# Patient Record
Sex: Male | Born: 1970 | Race: White | Hispanic: No | Marital: Married | Smoking: Never smoker
Health system: Southern US, Community
[De-identification: ages and names within clinical notes are randomized; demographics above are authoritative.]

## PROBLEM LIST (undated history)

## (undated) DIAGNOSIS — G5600 Carpal tunnel syndrome, unspecified upper limb: Secondary | ICD-10-CM

## (undated) HISTORY — PX: ANKLE SURGERY: SHX546

---

## 1999-09-16 ENCOUNTER — Encounter: Admission: RE | Admit: 1999-09-16 | Discharge: 1999-09-16 | Payer: Self-pay | Admitting: Family Medicine

## 1999-09-16 ENCOUNTER — Encounter: Payer: Self-pay | Admitting: Family Medicine

## 2004-12-24 ENCOUNTER — Ambulatory Visit (HOSPITAL_COMMUNITY): Admission: RE | Admit: 2004-12-24 | Discharge: 2004-12-24 | Payer: Self-pay | Admitting: Urology

## 2004-12-24 ENCOUNTER — Ambulatory Visit (HOSPITAL_BASED_OUTPATIENT_CLINIC_OR_DEPARTMENT_OTHER): Admission: RE | Admit: 2004-12-24 | Discharge: 2004-12-24 | Payer: Self-pay | Admitting: Urology

## 2004-12-24 ENCOUNTER — Encounter (INDEPENDENT_AMBULATORY_CARE_PROVIDER_SITE_OTHER): Payer: Self-pay | Admitting: *Deleted

## 2007-09-30 ENCOUNTER — Emergency Department (HOSPITAL_COMMUNITY): Admission: EM | Admit: 2007-09-30 | Discharge: 2007-09-30 | Payer: Self-pay | Admitting: Emergency Medicine

## 2010-11-03 ENCOUNTER — Encounter: Payer: Self-pay | Admitting: Family Medicine

## 2010-11-03 ENCOUNTER — Inpatient Hospital Stay (INDEPENDENT_AMBULATORY_CARE_PROVIDER_SITE_OTHER)
Admission: RE | Admit: 2010-11-03 | Discharge: 2010-11-03 | Disposition: A | Discharge status: Home / Self Care | Payer: 59 | Source: Ambulatory Visit | Attending: Family Medicine | Admitting: Family Medicine

## 2010-11-03 ENCOUNTER — Ambulatory Visit
Admission: RE | Admit: 2010-11-03 | Discharge: 2010-11-03 | Disposition: A | Discharge status: Home / Self Care | Payer: 59 | Source: Ambulatory Visit | Attending: Family Medicine | Admitting: Family Medicine

## 2010-11-03 ENCOUNTER — Other Ambulatory Visit: Payer: Self-pay | Admitting: Family Medicine

## 2010-11-03 DIAGNOSIS — T148XXA Other injury of unspecified body region, initial encounter: Secondary | ICD-10-CM

## 2010-11-03 DIAGNOSIS — R079 Chest pain, unspecified: Secondary | ICD-10-CM

## 2010-11-05 ENCOUNTER — Telehealth (INDEPENDENT_AMBULATORY_CARE_PROVIDER_SITE_OTHER): Payer: Self-pay | Admitting: *Deleted

## 2010-11-26 NOTE — Op Note (Signed)
NAME:  Timothy Pineda, Timothy Pineda              ACCOUNT NO.:  [id number]   MEDICAL RECORD NO.:  [id number]          PATIENT TYPE:  AMB   LOCATION:  [facility]                         FACILITY:  [facility]   PHYSICIAN:  Mark C. Vernie Ammons, M.D.  DATE OF BIRTH:  August 03, 1970   DATE OF PROCEDURE:  12/24/2004  DATE OF DISCHARGE:                                 OPERATIVE REPORT   PREOPERATIVE DIAGNOSES:  Multiple scrotal sebaceous cyst.   POSTOPERATIVE DIAGNOSES:  Multiple scrotal sebaceous cyst.   PROCEDURE:  Excision of scrotal sebaceous cyst.   SURGEON:  Mark C. Vernie Ammons, M.D.   ANESTHESIA:  General.   BLOOD LOSS:  Minimal.   DRAINS:  None.   SPECIMENS:  None.   COMPLICATIONS:  None.   INDICATIONS:  The patient is a 40 year old white whose had difficulty with  scrotal sebaceous cyst for some time. He has had them removed in the office  by his primary care physician in the past. He has multiple lesions at this  time not amenable to office base procedure. We discussed the risks,  complications and alternatives. He understands and elected to proceed with  surgical excision.   DESCRIPTION OF OPERATION:  After informed consent, the patient was brought  to the major OR, placed on the table, administered general anesthesia and  his genitalia was then sterilely prepped and draped. There were neural  multiple cysts all proximally 1 cm in size and or smaller. Some were grouped  and some were singly. Each of the single cysts, I incised over the top of  the cyst, dissected the cyst from the surrounding dermal and subdermal  tissue and then closed these with running 3-0 chromic suture. In locations  were there were multiple cysts in a group, I excised an ellipse of tissue  encompassing all the cysts. Bleeding points were cauterized and then these  were also closed with running 3-0 chromic. All of the cysts from the scrotum  were completely excised. The aggregate size would be approximately 8 cm in  length total.  The scrotum was then cleansed and Neosporin was applied as  well as fluffed Kerlix and a scrotal support. The patient was awakened and  taken to recovery room in stable satisfactory condition. He tolerated  procedure well with no intraoperative complications.   I gave him a prescription for 36 Tylox an 10 Keflex 500 mg b.i.d. He is  going to return to my office in one month for follow up, sooner if he should  have any difficulty.       MCO/MEDQ  D:  12/24/2004  T:  12/24/2004  Job:  [id number]

## 2011-04-04 LAB — URINALYSIS, ROUTINE W REFLEX MICROSCOPIC
Glucose, UA: NEGATIVE
Ketones, ur: 15 — AB
Leukocytes, UA: NEGATIVE
Nitrite: NEGATIVE
Protein, ur: 100 — AB
Specific Gravity, Urine: 1.029
Urobilinogen, UA: 0.2
pH: 5.5

## 2011-04-04 LAB — URINE MICROSCOPIC-ADD ON

## 2011-04-04 LAB — DIFFERENTIAL
Eosinophils Relative: 7 — ABNORMAL HIGH
Lymphocytes Relative: 33
Lymphs Abs: 2.2
Monocytes Relative: 6

## 2011-04-04 LAB — I-STAT 8, (EC8 V) (CONVERTED LAB)
Acid-Base Excess: 1
Chloride: 104
Glucose, Bld: 134 — ABNORMAL HIGH
Hemoglobin: 16.3
Potassium: 4.1
Sodium: 139
TCO2: 29

## 2011-04-04 LAB — CBC
HCT: 45.6
Hemoglobin: 15.5
Platelets: 303
RBC: 5.06
WBC: 6.7

## 2011-04-04 LAB — POCT I-STAT CREATININE
Creatinine, Ser: 1
Operator id: 196461

## 2011-06-13 NOTE — Telephone Encounter (Signed)
  Phone Note Outgoing Call   Call placed by: Clemens Catholic LPN,  November 05, 2010 4:57 PM Call placed to: Patient Summary of Call: call back: spoke to pts wife she states that he is doing a lot better. advised her of his lab results and to call back if they have any questions or concerns. Initial call taken by: Clemens Catholic LPN,  November 05, 2010 4:57 PM

## 2011-06-13 NOTE — Letter (Signed)
Summary: Out of Work  [facility]  [address]   [place], [place] [address]   Phone: [phone]  Fax: [phone]    November 03, 2010   Employee:  [name]    To Whom It May Concern:   For Medical reasons,  Timothy Pineda should avoid heavy lifting, pushing, pulling, and straining for one week.   If you need additional information, please feel free to contact our office.         Sincerely,    Donna Christen MD

## 2011-06-13 NOTE — Progress Notes (Signed)
Summary: CHEST PAIN/TICK BITE? (rm 2)   Vital Signs:  Patient Profile:   40 Years Old Male CC:      right chest pain x monday, increased today Height:     67 inches Weight:      175 pounds O2 Sat:      99 % O2 treatment:    Room Air Temp:     98.8 degrees F oral Pulse rate:   80 / minute Resp:     18 per minute BP sitting:   143 / 97  (right arm) Cuff size:   regular  Pt. in pain?   yes    Location:   right chest    Intensity:   2  Vitals Entered By: Lajean Saver RN (November 03, 2010 10:56 AM)                   Updated Prior Medication List: No Medications Current Allergies: No known allergies History of Present Illness Chief Complaint: right chest pain x monday, increased today History of Present Illness:  Subjective:  Patient complains of 3 day history of pain in his right anterior chest, worse with right arm movement and respiration.  No cough or shortness of breath.  The pain does not radiate.  No rash.  No fevers, chills, and sweats.  He recalls no injury or change in activities, but two days prior to the onset he had been working in a cramped space under a vehicle using his right arm to push up a muffler.   He also complains of a tick bite (deer tick) to his left anterior thigh that he noticed last week.                                                                                                                                                                    REVIEW OF SYSTEMS Constitutional Symptoms      Denies fever, chills, night sweats, weight loss, weight gain, and fatigue.  Eyes       Denies change in vision, eye pain, eye discharge, glasses, contact lenses, and eye surgery. Ear/Nose/Throat/Mouth       Denies hearing loss/aids, change in hearing, ear pain, ear discharge, dizziness, frequent runny nose, frequent nose bleeds, sinus problems, sore throat, hoarseness, and tooth pain or bleeding.  Respiratory       Denies dry cough, productive cough,  wheezing, shortness of breath, asthma, bronchitis, and emphysema/COPD.  Cardiovascular       Complains of chest pain.      Denies murmurs and tires easily with exhertion.    Gastrointestinal       Denies stomach pain, nausea/vomiting, diarrhea, constipation, blood in bowel movements, and indigestion. Genitourniary       Denies  painful urination, blood or discharge from penis, kidney stones, and loss of urinary control. Neurological       Denies paralysis, seizures, and fainting/blackouts. Musculoskeletal       Denies muscle pain, joint pain, joint stiffness, decreased range of motion, redness, swelling, muscle weakness, and gout.  Skin       Denies bruising, unusual mles/lumps or sores, and hair/skin or nail changes.  Psych       Denies mood changes, temper/anger issues, anxiety/stress, speech problems, depression, and sleep problems. Other Comments: tick bite x 6 days to left hip with redenss. Patient was working on a car on Saturday. Felt pain in right chest on Monday, it has increased today. He has little pain when still but with turning it is a 10/10.  Denies SOB, but breath "cathces" when he has increased pain.    Past History:  Past Medical History: Unremarkable  Past Surgical History: right ankle  Social History: Married Never Smoked Alcohol use-no Drug use-no Smoking Status:  never Drug Use:  no   Objective:  Appearance:  Patient appears healthy, stated age, and in no acute distress.  However, he appears uncomfortable with movement onto exam table Eyes:  Pupils are equal, round, and reactive to light and accomdation.  Extraocular movement is intact.  Conjunctivae are not inflamed.  Mouth:  No lesions Pharynx:  Normal  Neck:  Supple.  No adenopathy is present.  Lungs:  Clear to auscultation.  Breath sounds are equal.  Chest:  Distinct tenderness over the sternum and right pectoralis muscle, accentuated by flexion of the right pectoralis muscle Heart:  Regular rate  and rhythm without murmurs, rubs, or gallops.  Abdomen:  Nontender without masses or hepatosplenomegaly.  Bowel sounds are present.  No CVA or flank tenderness.  Extremities:  No edema.  Pedal pulses are full and equal.  Skin: L  ant thigh: 1.5cm dia patch erythema  Assessment New Problems: TICK BITE (ICD-E906.4) CHEST PAIN (ICD-786.50)  EKG unremarkable.  Chest X-ray negative.  Suspect mild cellulitis at site of tick bite  Plan New Medications/Changes: LORTAB 5 5-500 MG TABS (HYDROCODONE-ACETAMINOPHEN) One or two tabs by mouth hs as needed pain  #10 (ten) x 0, 11/03/2010, Donna Christen MD NAPROXEN 500 MG TABS (NAPROXEN) One by mouth two times a day pc  #20 x 1, 11/03/2010, Donna Christen MD DOXYCYCLINE HYCLATE 100 MG CAPS (DOXYCYCLINE HYCLATE) One by mouth two times a day  #20 x 0,  11/03/2010, Donna Christen MD  New Orders: Ketorolac-Toradol 15mg  [Z6109] EKG w/ Interpretation [93000] T-Chest x-ray, 2 views [71020] Admin of Therapeutic Inj  intramuscular or subcutaneous [96372] T-Rocky Mtn Spotted Fever AB IgG IgM [[id number]] T- * Misc. Laboratory test [phone] New Patient Level IV [99204] Planning Comments:   Check RMSF and Lyme's disease titers.  Empirically begin doxycycline.  If titers positive will need to extend antibiotic coverage to about 4 weeks. Toradol 60mg  IM Apply ice pack to chest several times daily.  Begin Naproxen.  Lortab at bedtime.  Given a Water quality scientist patient information and instruction sheet on topic costochondritis Followup with [facility] if not improved in two weeks.     The patient and/or caregiver has been counseled thoroughly with regard to medications prescribed including dosage, schedule, interactions, rationale for use, and possible side effects and they verbalize understanding.  Diagnoses and expected course of recovery discussed and will return if not improved as expected or if the condition worsens. Patient and/or caregiver verbalized understanding.  Prescriptions: LORTAB 5 5-500 MG TABS (HYDROCODONE-ACETAMINOPHEN) One or two tabs by mouth hs as needed pain  #10 (ten) x 0   Entered and Authorized by:   Donna Christen MD   Signed by:   Donna Christen MD on 11/03/2010   Method used:   Print then Give to Patient   RxID:   [id number] NAPROXEN 500 MG TABS (NAPROXEN) One by mouth two times a day pc  #20 x 1   Entered and Authorized by:   Donna Christen MD   Signed by:   Donna Christen MD on 11/03/2010   Method used:   Print then Give to Patient   RxID:   [id number] DOXYCYCLINE HYCLATE 100 MG CAPS (DOXYCYCLINE HYCLATE) One by mouth two times a day  #20 x 0   Entered and Authorized by:   Donna Christen MD   Signed by:   Donna Christen MD on 11/03/2010   Method used:   Print then Give to Patient   RxID:    [id number]   Medication Administration  Injection # 1:    Medication: Ketorolac-Toradol 15mg     Diagnosis: CHEST PAIN (ICD-786.50)    Route: IM    Site: RUOQ gluteus    Exp Date: 02/09/2011    Lot #: [id number]    Mfr: Hospira    Comments: 60mg  given    Patient tolerated injection without complications    Given by: Lajean Saver RN (November 03, 2010 11:14 AM)  Orders Added: 1)  Ketorolac-Toradol 15mg  [J1885] 2)  EKG w/ Interpretation [93000] 3)  T-Chest x-ray, 2 views [71020] 4)  Admin of Therapeutic Inj  intramuscular or subcutaneous [96372] 5)  T-Rocky Mtn Spotted Fever AB IgG IgM [[id number]] 6)  T- * Misc. Laboratory test [99999] 7)  New Patient Level IV [phone]

## 2015-07-13 DIAGNOSIS — M9903 Segmental and somatic dysfunction of lumbar region: Secondary | ICD-10-CM | POA: Diagnosis not present

## 2015-07-13 DIAGNOSIS — M5411 Radiculopathy, occipito-atlanto-axial region: Secondary | ICD-10-CM | POA: Diagnosis not present

## 2015-07-13 DIAGNOSIS — M9901 Segmental and somatic dysfunction of cervical region: Secondary | ICD-10-CM | POA: Diagnosis not present

## 2015-07-15 DIAGNOSIS — M9901 Segmental and somatic dysfunction of cervical region: Secondary | ICD-10-CM | POA: Diagnosis not present

## 2015-07-15 DIAGNOSIS — M5411 Radiculopathy, occipito-atlanto-axial region: Secondary | ICD-10-CM | POA: Diagnosis not present

## 2015-07-15 DIAGNOSIS — M9903 Segmental and somatic dysfunction of lumbar region: Secondary | ICD-10-CM | POA: Diagnosis not present

## 2015-07-16 DIAGNOSIS — M5411 Radiculopathy, occipito-atlanto-axial region: Secondary | ICD-10-CM | POA: Diagnosis not present

## 2015-07-16 DIAGNOSIS — M9901 Segmental and somatic dysfunction of cervical region: Secondary | ICD-10-CM | POA: Diagnosis not present

## 2015-07-16 DIAGNOSIS — M9903 Segmental and somatic dysfunction of lumbar region: Secondary | ICD-10-CM | POA: Diagnosis not present

## 2015-07-21 DIAGNOSIS — M5411 Radiculopathy, occipito-atlanto-axial region: Secondary | ICD-10-CM | POA: Diagnosis not present

## 2015-07-21 DIAGNOSIS — M9901 Segmental and somatic dysfunction of cervical region: Secondary | ICD-10-CM | POA: Diagnosis not present

## 2015-07-21 DIAGNOSIS — M9903 Segmental and somatic dysfunction of lumbar region: Secondary | ICD-10-CM | POA: Diagnosis not present

## 2015-07-23 DIAGNOSIS — M9901 Segmental and somatic dysfunction of cervical region: Secondary | ICD-10-CM | POA: Diagnosis not present

## 2015-07-23 DIAGNOSIS — M9903 Segmental and somatic dysfunction of lumbar region: Secondary | ICD-10-CM | POA: Diagnosis not present

## 2015-07-23 DIAGNOSIS — M5411 Radiculopathy, occipito-atlanto-axial region: Secondary | ICD-10-CM | POA: Diagnosis not present

## 2015-07-28 DIAGNOSIS — M9903 Segmental and somatic dysfunction of lumbar region: Secondary | ICD-10-CM | POA: Diagnosis not present

## 2015-07-28 DIAGNOSIS — M5411 Radiculopathy, occipito-atlanto-axial region: Secondary | ICD-10-CM | POA: Diagnosis not present

## 2015-07-28 DIAGNOSIS — M9901 Segmental and somatic dysfunction of cervical region: Secondary | ICD-10-CM | POA: Diagnosis not present

## 2015-07-30 DIAGNOSIS — M9903 Segmental and somatic dysfunction of lumbar region: Secondary | ICD-10-CM | POA: Diagnosis not present

## 2015-07-30 DIAGNOSIS — M9901 Segmental and somatic dysfunction of cervical region: Secondary | ICD-10-CM | POA: Diagnosis not present

## 2015-07-30 DIAGNOSIS — M5411 Radiculopathy, occipito-atlanto-axial region: Secondary | ICD-10-CM | POA: Diagnosis not present

## 2015-08-04 DIAGNOSIS — M9901 Segmental and somatic dysfunction of cervical region: Secondary | ICD-10-CM | POA: Diagnosis not present

## 2015-08-04 DIAGNOSIS — M9903 Segmental and somatic dysfunction of lumbar region: Secondary | ICD-10-CM | POA: Diagnosis not present

## 2015-08-04 DIAGNOSIS — M5411 Radiculopathy, occipito-atlanto-axial region: Secondary | ICD-10-CM | POA: Diagnosis not present

## 2015-08-11 DIAGNOSIS — M9901 Segmental and somatic dysfunction of cervical region: Secondary | ICD-10-CM | POA: Diagnosis not present

## 2015-08-11 DIAGNOSIS — M5411 Radiculopathy, occipito-atlanto-axial region: Secondary | ICD-10-CM | POA: Diagnosis not present

## 2015-08-11 DIAGNOSIS — M9903 Segmental and somatic dysfunction of lumbar region: Secondary | ICD-10-CM | POA: Diagnosis not present

## 2015-08-20 DIAGNOSIS — M5411 Radiculopathy, occipito-atlanto-axial region: Secondary | ICD-10-CM | POA: Diagnosis not present

## 2015-08-20 DIAGNOSIS — M9903 Segmental and somatic dysfunction of lumbar region: Secondary | ICD-10-CM | POA: Diagnosis not present

## 2015-08-20 DIAGNOSIS — M9901 Segmental and somatic dysfunction of cervical region: Secondary | ICD-10-CM | POA: Diagnosis not present

## 2016-02-27 ENCOUNTER — Emergency Department (HOSPITAL_BASED_OUTPATIENT_CLINIC_OR_DEPARTMENT_OTHER)
Admission: EM | Admit: 2016-02-27 | Discharge: 2016-02-28 | Disposition: A | Discharge status: Home / Self Care | Payer: 59 | Attending: Emergency Medicine | Admitting: Emergency Medicine

## 2016-02-27 ENCOUNTER — Emergency Department (HOSPITAL_BASED_OUTPATIENT_CLINIC_OR_DEPARTMENT_OTHER): Payer: 59

## 2016-02-27 ENCOUNTER — Encounter (HOSPITAL_BASED_OUTPATIENT_CLINIC_OR_DEPARTMENT_OTHER): Payer: Self-pay | Admitting: *Deleted

## 2016-02-27 DIAGNOSIS — M940 Chondrocostal junction syndrome [Tietze]: Secondary | ICD-10-CM | POA: Diagnosis not present

## 2016-02-27 DIAGNOSIS — R0602 Shortness of breath: Secondary | ICD-10-CM | POA: Diagnosis not present

## 2016-02-27 DIAGNOSIS — R05 Cough: Secondary | ICD-10-CM | POA: Diagnosis present

## 2016-02-27 LAB — CBC
HEMATOCRIT: 41.9 % (ref 39.0–52.0)
HEMOGLOBIN: 14.8 g/dL (ref 13.0–17.0)
MCH: 31.6 pg (ref 26.0–34.0)
MCHC: 35.3 g/dL (ref 30.0–36.0)
MCV: 89.5 fL (ref 78.0–100.0)
Platelets: 289 10*3/uL (ref 150–400)
RBC: 4.68 MIL/uL (ref 4.22–5.81)
RDW: 13.1 % (ref 11.5–15.5)
WBC: 7.9 10*3/uL (ref 4.0–10.5)

## 2016-02-27 LAB — BASIC METABOLIC PANEL
ANION GAP: 10 (ref 5–15)
BUN: 10 mg/dL (ref 6–20)
CO2: 23 mmol/L (ref 22–32)
Calcium: 9.2 mg/dL (ref 8.9–10.3)
Chloride: 105 mmol/L (ref 101–111)
Creatinine, Ser: 0.95 mg/dL (ref 0.61–1.24)
GFR calc Af Amer: >60 mL/min (ref >60)
Glucose, Bld: 95 mg/dL (ref 65–99)
POTASSIUM: 3.3 mmol/L — AB (ref 3.5–5.1)
SODIUM: 138 mmol/L (ref 135–145)

## 2016-02-27 LAB — TROPONIN I: Troponin I: <0.03 ng/mL (ref <0.03)

## 2016-02-27 MED ORDER — KETOROLAC TROMETHAMINE 60 MG/2ML IM SOLN
60.0000 mg | Freq: Once | INTRAMUSCULAR | Status: AC
  Administered 2016-02-27: 60 mg via INTRAMUSCULAR
  Filled 2016-02-27: qty 2

## 2016-02-27 MED ORDER — GI COCKTAIL ~~LOC~~
30.0000 mL | Freq: Once | ORAL | Status: AC
  Administered 2016-02-27: 30 mL via ORAL
  Filled 2016-02-27: qty 30

## 2016-02-27 NOTE — ED Provider Notes (Signed)
[facility] Provider Note   CSN: [id number] Arrival date & time: 02/27/16  2153  By signing my name below, I, Aggie Moats, attest that this documentation has been prepared under the direction and in the presence of Rubin Dais, MD. Electronically Signed: Aggie Moats, ED Scribe. 02/27/16. 11:13 PM.    History   Chief Complaint Chief Complaint  Patient presents with  . Chest Pain   The history is provided by the patient. No language interpreter was used.  Chest Pain   This is a new problem. The current episode started yesterday. The problem occurs constantly. The problem has not changed since onset.The pain is associated with movement and breathing. The pain is present in the substernal region. The pain is moderate. The quality of the pain is described as dull. The pain does not radiate. Duration of episode(s) is 1 day. The symptoms are aggravated by deep breathing. Associated symptoms include cough. Pertinent negatives include no abdominal pain, no back pain, no claudication, no diaphoresis, no dizziness, no exertional chest pressure, no fever, no headaches, no hemoptysis, no irregular heartbeat, no leg pain, no lower extremity edema, no malaise/fatigue, no nausea, no near-syncope, no numbness, no orthopnea, no palpitations, no PND, no shortness of breath, no sputum production, no syncope, no vomiting and no weakness. He has tried nothing for the symptoms. The treatment provided no relief. Risk factors include male gender.  Pertinent negatives for past medical history include no aneurysm, no connective tissue disease, no DVT, no Marfan's syndrome, no MI and no PE.  Pertinent negatives for family medical history include: no aortic dissection.  Procedure history is negative for cardiac catheterization.   HPI Comments:  Timothy Pineda is a 45 y.o. male who presents to the Emergency Department complaining of constant, moderate chest pain, which started last night. Pt reports that he  noticed himself breathing heavier than normal last night. Pain is described as dull. Associated symptoms include SOB, nonproductive cough and fatigue. Pt has taken two aspirins today. No modifying factors. Denies wheezing, abdominal pain, sore throat or edema. No recent long travels.  No leg pain.     History reviewed. No pertinent past medical history.  There are no active problems to display for this patient.   History reviewed. No pertinent surgical history.     Home Medications    Prior to Admission medications   Not on File    Family History History reviewed. No pertinent family history.  Social History Social History  Substance Use Topics  . Smoking status: Never Smoker  . Smokeless tobacco: Never Used  . Alcohol use Yes     Allergies   Review of patient's allergies indicates no known allergies.   Review of Systems Review of Systems  Constitutional: Negative for appetite change, diaphoresis, fatigue, fever and malaise/fatigue.  HENT: Negative for sore throat.   Respiratory: Positive for cough. Negative for hemoptysis, sputum production, chest tightness, shortness of breath and wheezing.   Cardiovascular: Positive for chest pain. Negative for palpitations, orthopnea, claudication, leg swelling, syncope, PND and near-syncope.  Gastrointestinal: Negative for abdominal pain, nausea and vomiting.  Musculoskeletal: Negative for back pain.  Neurological: Negative for dizziness, weakness, numbness and headaches.  All other systems reviewed and are negative.    Physical Exam Updated Vital Signs BP 118/90   Pulse 80   Temp 98 F (36.7 C) (Oral)   Resp 18   Ht 5\' 7"  (1.702 m)   Wt 170 lb (77.1 kg)   SpO2 97%  BMI 26.63 kg/m   Physical Exam  Constitutional: He appears well-developed and well-nourished.  HENT:  Head: Normocephalic.  Mouth/Throat: No oropharyngeal exudate.  Eyes: Conjunctivae and EOM are normal. Pupils are equal, round, and reactive to  light. Right eye exhibits no discharge. Left eye exhibits no discharge. No scleral icterus.  Neck: Normal range of motion. Neck supple. No JVD present. No tracheal deviation present.  Trachea is midline. No stridor or carotid bruits.  Cardiovascular: Normal rate, regular rhythm, normal heart sounds and intact distal pulses.   No murmur heard. Pulmonary/Chest: Effort normal and breath sounds normal. No stridor. No respiratory distress. He has no wheezes. He has no rales. He exhibits tenderness.  Lungs CTA bilaterally.  Abdominal: Soft. He exhibits no distension. There is no tenderness. There is no rebound and no guarding.  Very hyperactive bowel sounds throughout.  Musculoskeletal: Normal range of motion. He exhibits no edema or tenderness.       Right lower leg: Normal. He exhibits no tenderness, no bony tenderness, no swelling, no edema, no deformity and no laceration.       Left lower leg: Normal. He exhibits no tenderness, no bony tenderness, no swelling, no edema, no deformity and no laceration.  No cords   Lymphadenopathy:    He has no cervical adenopathy.  Neurological: He is alert. He has normal reflexes.  Skin: Skin is warm. Capillary refill takes less than 2 seconds.  Psychiatric: He has a normal mood and affect. His behavior is normal.  Nursing note and vitals reviewed.    ED Treatments / Results  DIAGNOSTIC STUDIES: Vitals:   02/28/16 0130 02/28/16 0151  BP: 142/99 142/99  Pulse: 84 92  Resp: 12 15  Temp:      Oxygen Saturation is 97% on room air, normal by my interpretation.   Results for orders placed or performed during the hospital encounter of 02/27/16  Basic metabolic panel  Result Value Ref Range   Sodium 138 135 - 145 mmol/L   Potassium 3.3 (L) 3.5 - 5.1 mmol/L   Chloride 105 101 - 111 mmol/L   CO2 23 22 - 32 mmol/L   Glucose, Bld 95 65 - 99 mg/dL   BUN 10 6 - 20 mg/dL   Creatinine, Ser 4.690.95 0.61 - 1.24 mg/dL   Calcium 9.2 8.9 - 62.910.3 mg/dL   GFR calc non  Af Amer >60 >60 mL/min   GFR calc Af Amer >60 >60 mL/min   Anion gap 10 5 - 15  CBC  Result Value Ref Range   WBC 7.9 4.0 - 10.5 K/uL   RBC 4.68 4.22 - 5.81 MIL/uL   Hemoglobin 14.8 13.0 - 17.0 g/dL   HCT 52.841.9 41.339.0 - 24.452.0 %   MCV 89.5 78.0 - 100.0 fL   MCH 31.6 26.0 - 34.0 pg   MCHC 35.3 30.0 - 36.0 g/dL   RDW 01.013.1 27.211.5 - 53.615.5 %   Platelets 289 150 - 400 K/uL  Troponin I  Result Value Ref Range   Troponin I <0.03 <0.03 ng/mL  Troponin I  Result Value Ref Range   Troponin I <0.03 <0.03 ng/mL   Dg Chest 2 View  Result Date: 02/27/2016 CLINICAL DATA:  Shortness of breath and chest pressure beginning yesterday. EXAM: CHEST  2 VIEW COMPARISON:  None. FINDINGS: Heart size is normal. Mediastinal shadows are normal. The lungs are clear except for a left lower lobe granuloma. No bronchial thickening. No infiltrate, mass, effusion or collapse. Pulmonary vascularity is normal.  No bony abnormality. IMPRESSION: Normal except for left lower lobe granuloma. Electronically Signed   By: Paulina FusiMark  Shogry M.D.   On: 02/27/2016 22:31    COORDINATION OF CARE:  11:13 PM Discussed treatment plan with pt at bedside, which includes repeat blood work, and pt agreed to plan.  Procedures Procedures (including critical care time)  Medications Ordered in ED Medications  ketorolac (TORADOL) injection 60 mg (60 mg Intramuscular Given 02/27/16 2357)  gi cocktail (Maalox,Lidocaine,Donnatal) (30 mLs Oral Given 02/27/16 2357)  methocarbamol (ROBAXIN) tablet 1,000 mg (1,000 mg Oral Given 02/28/16 0021)     Initial Impression / Assessment and Plan / ED Course  I have reviewed the triage vital signs and the nursing notes.  Pertinent labs & imaging results that were available during my care of the patient were reviewed by me and considered in my medical decision making (see chart for details).  Clinical Course   Vitals:   02/28/16 0130 02/28/16 0151  BP: 142/99 142/99  Pulse: 84 92  Resp: 12 15  Temp:      Results for orders placed or performed during the hospital encounter of 02/27/16  Basic metabolic panel  Result Value Ref Range   Sodium 138 135 - 145 mmol/L   Potassium 3.3 (L) 3.5 - 5.1 mmol/L   Chloride 105 101 - 111 mmol/L   CO2 23 22 - 32 mmol/L   Glucose, Bld 95 65 - 99 mg/dL   BUN 10 6 - 20 mg/dL   Creatinine, Ser 0.270.95 0.61 - 1.24 mg/dL   Calcium 9.2 8.9 - 25.310.3 mg/dL   GFR calc non Af Amer >60 >60 mL/min   GFR calc Af Amer >60 >60 mL/min   Anion gap 10 5 - 15  CBC  Result Value Ref Range   WBC 7.9 4.0 - 10.5 K/uL   RBC 4.68 4.22 - 5.81 MIL/uL   Hemoglobin 14.8 13.0 - 17.0 g/dL   HCT 66.441.9 40.339.0 - 47.452.0 %   MCV 89.5 78.0 - 100.0 fL   MCH 31.6 26.0 - 34.0 pg   MCHC 35.3 30.0 - 36.0 g/dL   RDW 25.913.1 56.311.5 - 87.515.5 %   Platelets 289 150 - 400 K/uL  Troponin I  Result Value Ref Range   Troponin I <0.03 <0.03 ng/mL  Troponin I  Result Value Ref Range   Troponin I <0.03 <0.03 ng/mL   Dg Chest 2 View  Result Date: 02/27/2016 CLINICAL DATA:  Shortness of breath and chest pressure beginning yesterday. EXAM: CHEST  2 VIEW COMPARISON:  None. FINDINGS: Heart size is normal. Mediastinal shadows are normal. The lungs are clear except for a left lower lobe granuloma. No bronchial thickening. No infiltrate, mass, effusion or collapse. Pulmonary vascularity is normal. No bony abnormality. IMPRESSION: Normal except for left lower lobe granuloma. Electronically Signed   By: Paulina FusiMark  Shogry M.D.   On: 02/27/2016 22:31     EKG Interpretation  Date/Time:  Saturday February 27 2016 22:01:54 EDT Ventricular Rate:  76 PR Interval:  152 QRS Duration: 92 QT Interval:  354 QTC Calculation: 398 R Axis:   60 Text Interpretation:  Normal sinus rhythm with sinus arrhythmia Confirmed by [facility]  MD, Aislinn Feliz ([id number]) on 02/27/2016 11:13:05 PM       Final Clinical Impressions(s) / ED Diagnoses   Final diagnoses:  None  PERC negative Wells 0 highly doubt PE. Rules out for MI with negative EKG and  2 negative troponins in the setting of ongoing pain.   Feeling  better post medication.  HEART score is 1.  Pain is consistent with costochondritis.  Will treat for same.  All questions answered to patient's satisfaction. Based on history and exam patient has been appropriately medically screened and emergency conditions excluded. Patient is stable for discharge at this time. Follow up with your PMDfor recheck in 2 daysand strict return precautions given.   New Prescriptions New Prescriptions   No medications on file  I personally performed the services described in this documentation, which was scribed in my presence. The recorded information has been reviewed and is accurate.       Cy Blamer, MD 02/28/16 [phone]

## 2016-02-27 NOTE — ED Triage Notes (Signed)
Pt states he noticed himself breathing heavier than usual last night. [facility] and [facility] was sore "like someone sitting on me." Radiates to left. Non prod cough at times. No energy. Denies other s/s.

## 2016-02-28 ENCOUNTER — Encounter (HOSPITAL_BASED_OUTPATIENT_CLINIC_OR_DEPARTMENT_OTHER): Payer: Self-pay | Admitting: Emergency Medicine

## 2016-02-28 DIAGNOSIS — M940 Chondrocostal junction syndrome [Tietze]: Secondary | ICD-10-CM | POA: Diagnosis not present

## 2016-02-28 LAB — TROPONIN I

## 2016-02-28 MED ORDER — METHOCARBAMOL 500 MG PO TABS
1000.0000 mg | ORAL_TABLET | Freq: Once | ORAL | Status: AC
  Administered 2016-02-28: 1000 mg via ORAL
  Filled 2016-02-28: qty 2

## 2016-02-28 MED ORDER — DICLOFENAC SODIUM ER 100 MG PO TB24: 100.0000 mg | ORAL_TABLET | Freq: Every day | ORAL | 0 refills | Status: DC

## 2016-02-28 MED ORDER — METHOCARBAMOL 500 MG PO TABS: 500.0000 mg | ORAL_TABLET | Freq: Two times a day (BID) | ORAL | 0 refills | Status: DC

## 2016-12-29 DIAGNOSIS — M9903 Segmental and somatic dysfunction of lumbar region: Secondary | ICD-10-CM | POA: Diagnosis not present

## 2016-12-29 DIAGNOSIS — M5411 Radiculopathy, occipito-atlanto-axial region: Secondary | ICD-10-CM | POA: Diagnosis not present

## 2016-12-29 DIAGNOSIS — M9901 Segmental and somatic dysfunction of cervical region: Secondary | ICD-10-CM | POA: Diagnosis not present

## 2017-01-05 DIAGNOSIS — M9903 Segmental and somatic dysfunction of lumbar region: Secondary | ICD-10-CM | POA: Diagnosis not present

## 2017-01-05 DIAGNOSIS — M5411 Radiculopathy, occipito-atlanto-axial region: Secondary | ICD-10-CM | POA: Diagnosis not present

## 2017-01-05 DIAGNOSIS — M9901 Segmental and somatic dysfunction of cervical region: Secondary | ICD-10-CM | POA: Diagnosis not present

## 2017-01-12 DIAGNOSIS — M9901 Segmental and somatic dysfunction of cervical region: Secondary | ICD-10-CM | POA: Diagnosis not present

## 2017-01-12 DIAGNOSIS — M5411 Radiculopathy, occipito-atlanto-axial region: Secondary | ICD-10-CM | POA: Diagnosis not present

## 2017-01-12 DIAGNOSIS — M9903 Segmental and somatic dysfunction of lumbar region: Secondary | ICD-10-CM | POA: Diagnosis not present

## 2017-01-19 DIAGNOSIS — M9901 Segmental and somatic dysfunction of cervical region: Secondary | ICD-10-CM | POA: Diagnosis not present

## 2017-01-19 DIAGNOSIS — M9903 Segmental and somatic dysfunction of lumbar region: Secondary | ICD-10-CM | POA: Diagnosis not present

## 2017-01-19 DIAGNOSIS — M5411 Radiculopathy, occipito-atlanto-axial region: Secondary | ICD-10-CM | POA: Diagnosis not present

## 2017-02-13 DIAGNOSIS — N528 Other male erectile dysfunction: Secondary | ICD-10-CM | POA: Diagnosis not present

## 2017-02-15 MED FILL — SILDENAFIL CITRATE 100 MG T: 100 | 30 days supply | Qty: 6 | Fill #0

## 2017-03-07 IMAGING — CR DG CHEST 2V
2 series · 2 of 2 positions shown · non-contrast
Comparison: None.

CLINICAL DATA: Shortness of breath and chest pressure beginning
yesterday.

EXAM:
CHEST  2 VIEW

[w chest pa]
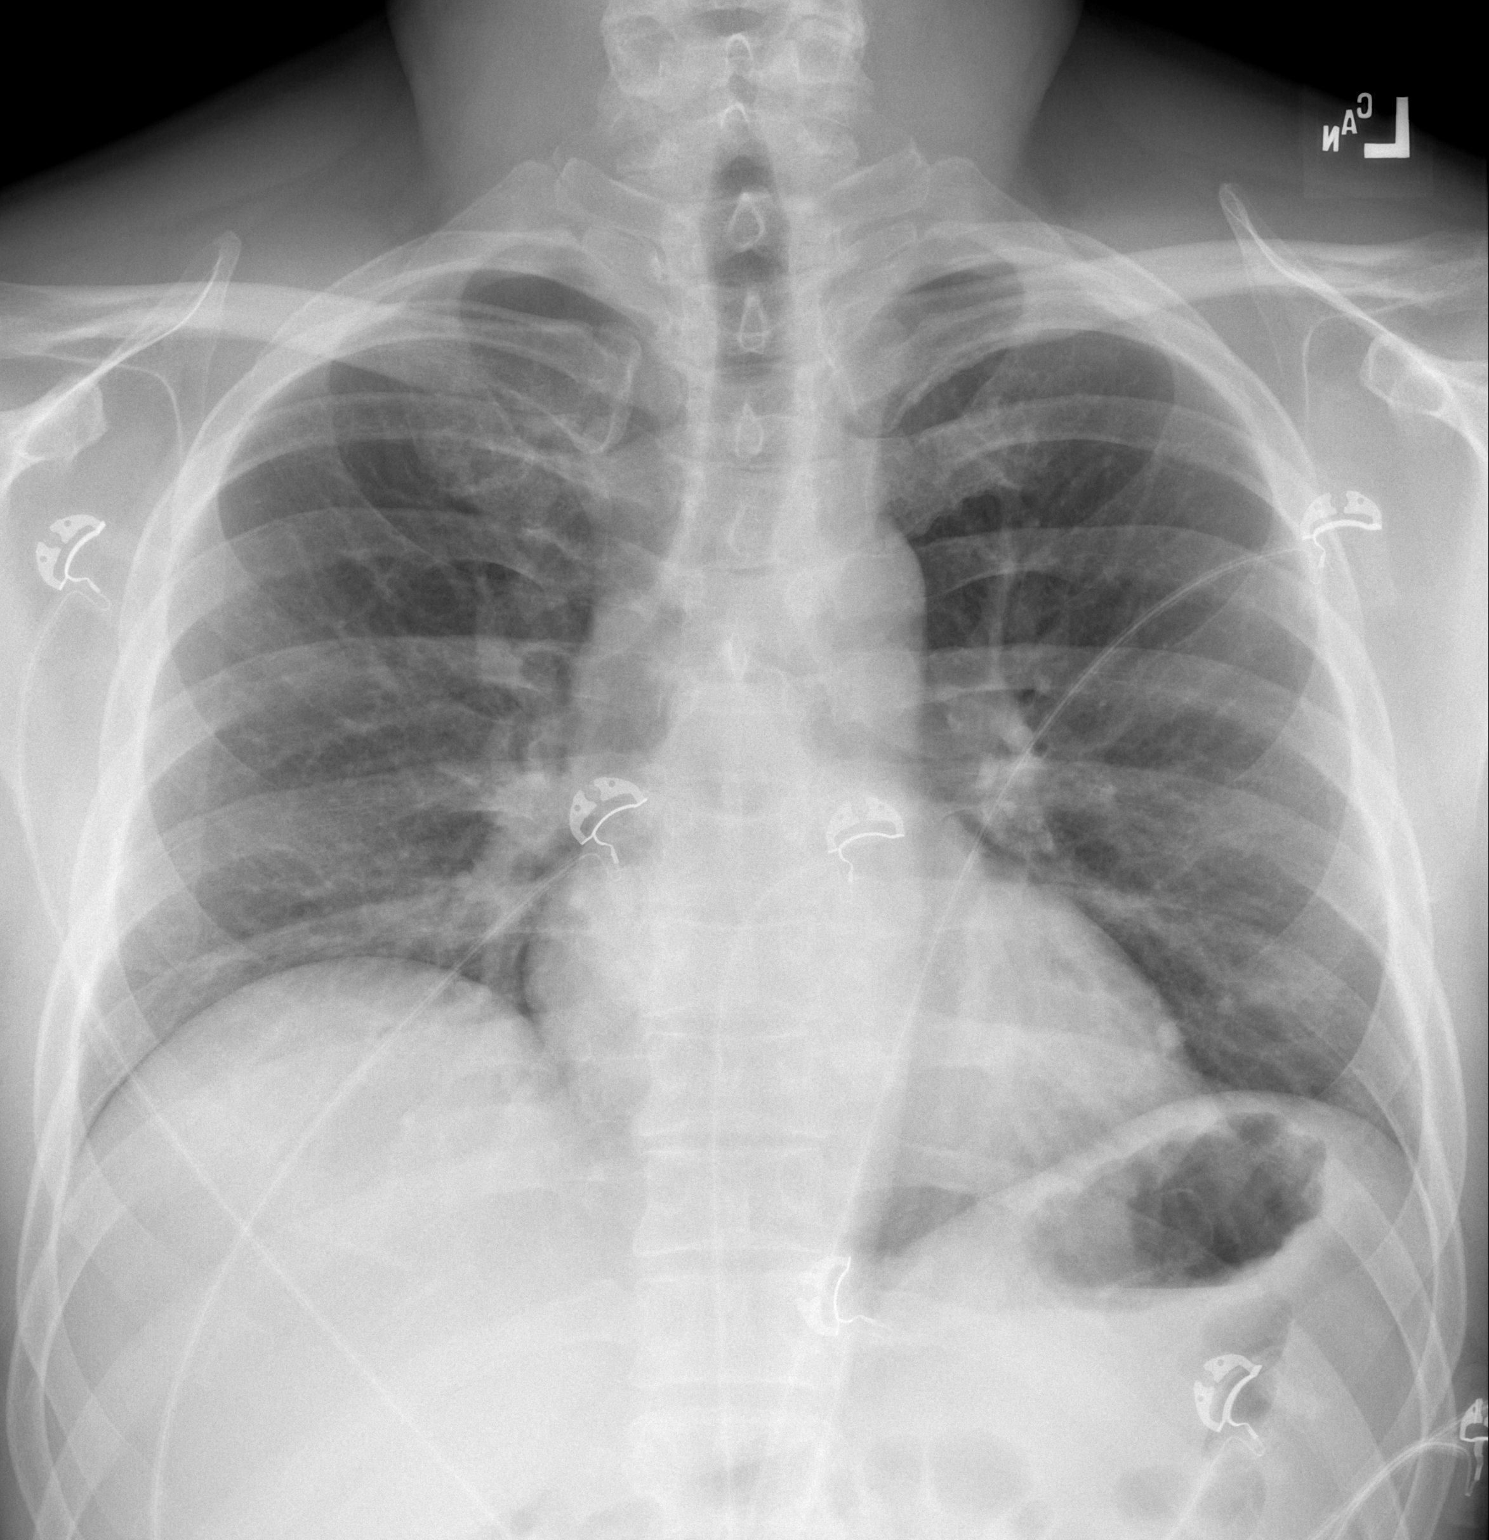

[w chest lat]
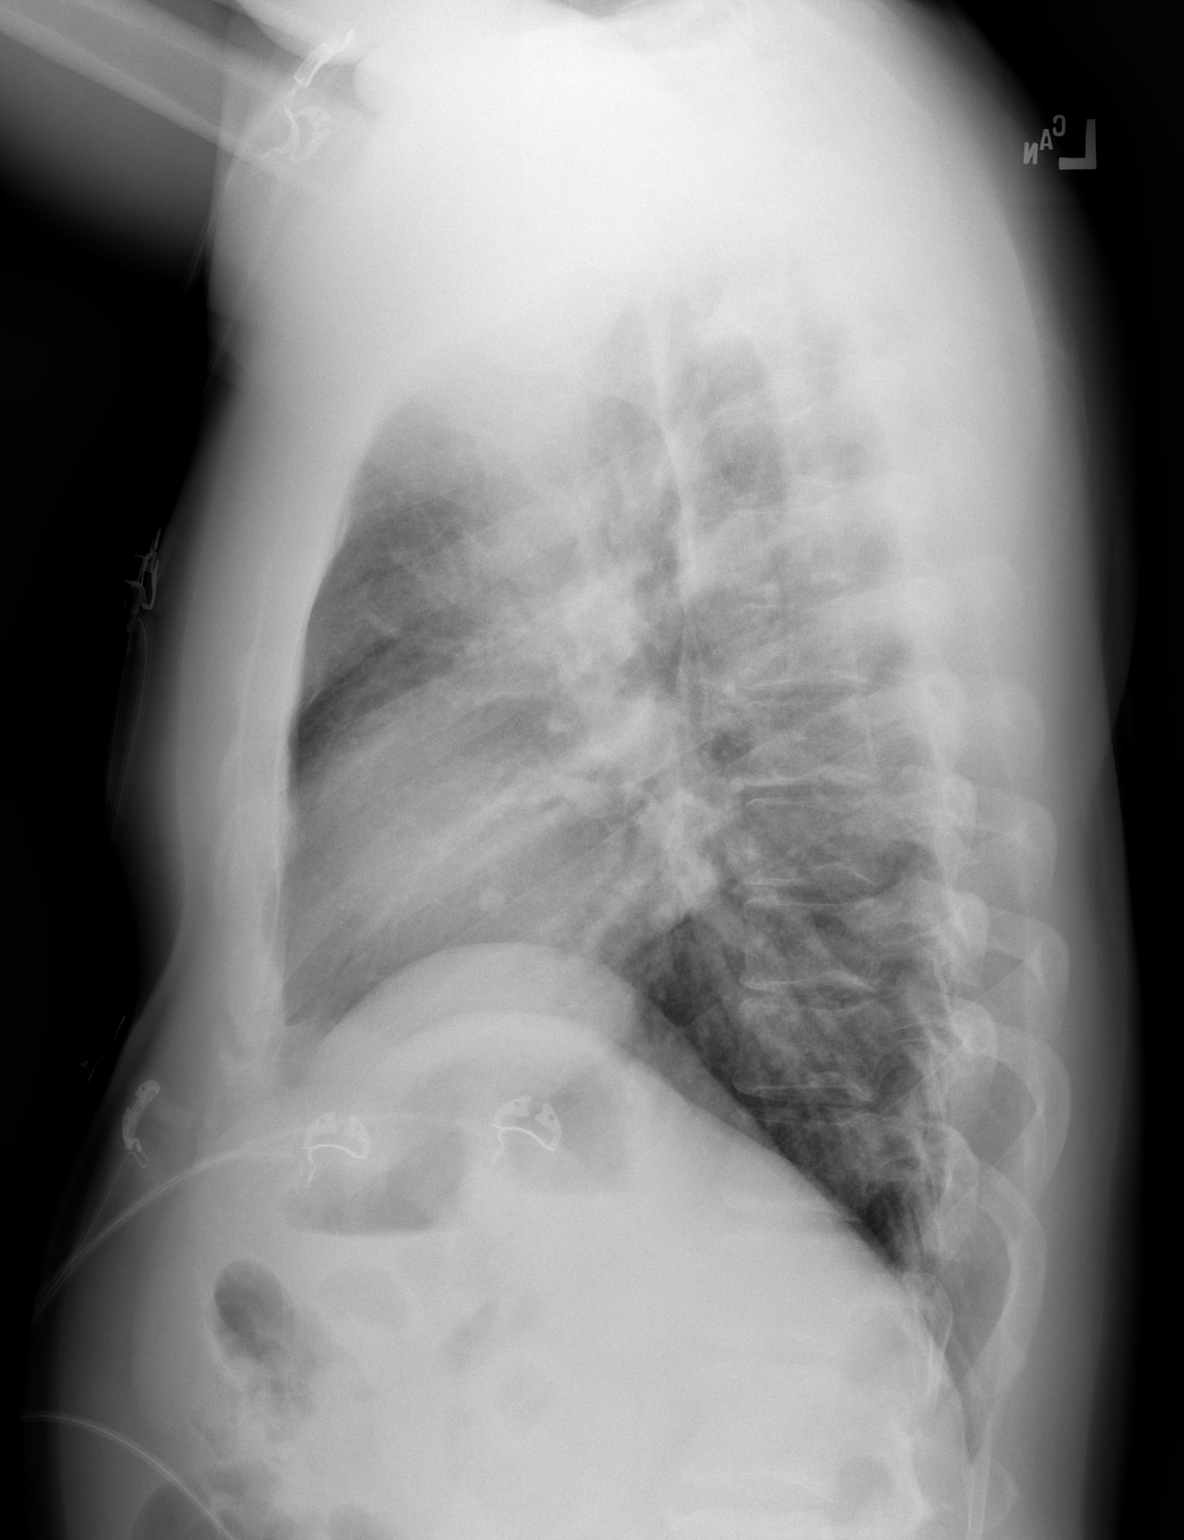

[2 of 2 positions shown; findings below may reference images not displayed]

FINDINGS: Heart size is normal. Mediastinal shadows are normal. The lungs are
clear except for a left lower lobe granuloma. No bronchial
thickening. No infiltrate, mass, effusion or collapse. Pulmonary
vascularity is normal. No bony abnormality.
IMPRESSION: Normal except for left lower lobe granuloma.

## 2017-05-08 DIAGNOSIS — L989 Disorder of the skin and subcutaneous tissue, unspecified: Secondary | ICD-10-CM | POA: Diagnosis not present

## 2017-05-08 DIAGNOSIS — Z13220 Encounter for screening for lipoid disorders: Secondary | ICD-10-CM | POA: Diagnosis not present

## 2017-05-08 DIAGNOSIS — Z791 Long term (current) use of non-steroidal anti-inflammatories (NSAID): Secondary | ICD-10-CM | POA: Diagnosis not present

## 2017-05-08 DIAGNOSIS — Z23 Encounter for immunization: Secondary | ICD-10-CM | POA: Diagnosis not present

## 2017-05-08 DIAGNOSIS — Z0000 Encounter for general adult medical examination without abnormal findings: Secondary | ICD-10-CM | POA: Diagnosis not present

## 2017-05-08 DIAGNOSIS — Z113 Encounter for screening for infections with a predominantly sexual mode of transmission: Secondary | ICD-10-CM | POA: Diagnosis not present

## 2017-05-08 DIAGNOSIS — R51 Headache: Secondary | ICD-10-CM | POA: Diagnosis not present

## 2017-06-23 DIAGNOSIS — L918 Other hypertrophic disorders of the skin: Secondary | ICD-10-CM | POA: Diagnosis not present

## 2017-12-26 DIAGNOSIS — M940 Chondrocostal junction syndrome [Tietze]: Secondary | ICD-10-CM | POA: Diagnosis not present

## 2017-12-26 MED FILL — DICLOFENAC SOD ER 100 MG TA: 100 | 10 days supply | Qty: 10 | Fill #0

## 2017-12-26 MED FILL — METHOCARBAMOL 500 MG TABLET: 500 | 10 days supply | Qty: 20 | Fill #0

## 2018-06-11 DIAGNOSIS — M9903 Segmental and somatic dysfunction of lumbar region: Secondary | ICD-10-CM | POA: Diagnosis not present

## 2018-06-11 DIAGNOSIS — M9901 Segmental and somatic dysfunction of cervical region: Secondary | ICD-10-CM | POA: Diagnosis not present

## 2018-06-11 DIAGNOSIS — M5411 Radiculopathy, occipito-atlanto-axial region: Secondary | ICD-10-CM | POA: Diagnosis not present

## 2018-06-14 DIAGNOSIS — M9901 Segmental and somatic dysfunction of cervical region: Secondary | ICD-10-CM | POA: Diagnosis not present

## 2018-06-14 DIAGNOSIS — M5411 Radiculopathy, occipito-atlanto-axial region: Secondary | ICD-10-CM | POA: Diagnosis not present

## 2018-06-14 DIAGNOSIS — M9903 Segmental and somatic dysfunction of lumbar region: Secondary | ICD-10-CM | POA: Diagnosis not present

## 2018-06-21 DIAGNOSIS — M9901 Segmental and somatic dysfunction of cervical region: Secondary | ICD-10-CM | POA: Diagnosis not present

## 2018-06-21 DIAGNOSIS — M5411 Radiculopathy, occipito-atlanto-axial region: Secondary | ICD-10-CM | POA: Diagnosis not present

## 2018-06-21 DIAGNOSIS — M9903 Segmental and somatic dysfunction of lumbar region: Secondary | ICD-10-CM | POA: Diagnosis not present

## 2018-06-26 DIAGNOSIS — M9901 Segmental and somatic dysfunction of cervical region: Secondary | ICD-10-CM | POA: Diagnosis not present

## 2018-06-26 DIAGNOSIS — M5411 Radiculopathy, occipito-atlanto-axial region: Secondary | ICD-10-CM | POA: Diagnosis not present

## 2018-06-26 DIAGNOSIS — M9903 Segmental and somatic dysfunction of lumbar region: Secondary | ICD-10-CM | POA: Diagnosis not present

## 2018-07-09 DIAGNOSIS — M5411 Radiculopathy, occipito-atlanto-axial region: Secondary | ICD-10-CM | POA: Diagnosis not present

## 2018-07-09 DIAGNOSIS — M9903 Segmental and somatic dysfunction of lumbar region: Secondary | ICD-10-CM | POA: Diagnosis not present

## 2018-07-09 DIAGNOSIS — M9901 Segmental and somatic dysfunction of cervical region: Secondary | ICD-10-CM | POA: Diagnosis not present

## 2018-07-13 MED FILL — SILDENAFIL CITRATE 100 MG T: 100 | 30 days supply | Qty: 6 | Fill #0

## 2018-07-31 ENCOUNTER — Encounter: Payer: Self-pay | Admitting: Physician Assistant

## 2018-07-31 ENCOUNTER — Ambulatory Visit: Payer: Self-pay | Admitting: Physician Assistant

## 2018-07-31 VITALS — BP 145/78 | HR 93 | Temp 99.2°F | Resp 18 | Wt 188.0 lb

## 2018-07-31 DIAGNOSIS — R030 Elevated blood-pressure reading, without diagnosis of hypertension: Secondary | ICD-10-CM

## 2018-07-31 DIAGNOSIS — R6889 Other general symptoms and signs: Secondary | ICD-10-CM

## 2018-07-31 DIAGNOSIS — J111 Influenza due to unidentified influenza virus with other respiratory manifestations: Secondary | ICD-10-CM

## 2018-07-31 DIAGNOSIS — R69 Illness, unspecified: Secondary | ICD-10-CM

## 2018-07-31 LAB — POCT INFLUENZA A/B
INFLUENZA A, POC: NEGATIVE
Influenza B, POC: NEGATIVE

## 2018-07-31 MED ORDER — BENZOCAINE-MENTHOL 15-3.6 MG MT LOZG: 1.0000 | LOZENGE | OROMUCOSAL | 0 refills | Status: DC | PRN

## 2018-07-31 MED ORDER — OSELTAMIVIR PHOSPHATE 75 MG PO CAPS: 75.0000 mg | ORAL_CAPSULE | Freq: Two times a day (BID) | ORAL | 0 refills | Status: DC

## 2018-07-31 MED FILL — OSELTAMIVIR PHOSPHATE 75 MG: 75 | 5 days supply | Qty: 10 | Fill #0

## 2018-07-31 MED FILL — SORE THROAT LOZENGE: 15-3.6 | 2 days supply | Qty: 18 | Fill #0

## 2018-07-31 NOTE — Patient Instructions (Addendum)
Your symptoms are suggestive of a flulike illness.  I have sent in a prescription for Tamiflu.  You are contagious until you are  symptom-free for 24 hours.Please stay out of work until you are no longer contagious.  You can use over-the-counter Tylenol and ibuprofen for general discomfort and fever.  Use Cepacol lunges for sore throat.  Two major complications after the flu are pneumonia and sinus infections. Please be aware of this and if you are not any better in 7-10 days or you develop worsening cough or sinus pressure, seek care at family doctor or the ED. Continue to wash your hands and wear a mask daily especially around other people.   In terms of elevated blood pressure, I would like you to check your blood pressure at least a couple times over the next week outside of the office and document these values. It is best if you check the blood pressure at different times in the day. Your goal is <140/90. If your values are consistently above this goal, please contact your family doctor for further evaluation. If you start to have chest pain, blurred vision, shortness of breath, severe headache, lower leg swelling, or nausea/vomiting please seek care immediately here or at the ED.     Influenza, Adult Influenza is also called "the flu." It is an infection in the lungs, nose, and throat (respiratory tract). It is caused by a virus. The flu causes symptoms that are similar to symptoms of a cold. It also causes a high fever and body aches. The flu spreads easily from person to person (is contagious). Getting a flu shot (influenza vaccination) every year is the best way to prevent the flu. What are the causes? This condition is caused by the influenza virus. You can get the virus by:  Breathing in droplets that are in the air from the cough or sneeze of a person who has the virus.  Touching something that has the virus on it (is contaminated) and then touching your mouth, nose, or eyes. What  increases the risk? Certain things may make you more likely to get the flu. These include:  Not washing your hands often.  Having close contact with many people during cold and flu season.  Touching your mouth, eyes, or nose without first washing your hands.  Not getting a flu shot every year. You may have a higher risk for the flu, along with serious problems such as a lung infection (pneumonia), if you:  Are older than 65.  Are pregnant.  Have a weakened disease-fighting system (immune system) because of a disease or taking certain medicines.  Have a long-term (chronic) illness, such as: ? Heart, kidney, or lung disease. ? Diabetes. ? Asthma.  Have a liver disorder.  Are very overweight (morbidly obese).  Have anemia. This is a condition that affects your red blood cells. What are the signs or symptoms? Symptoms usually begin suddenly and last 4-14 days. They may include:  Fever and chills.  Headaches, body aches, or muscle aches.  Sore throat.  Cough.  Runny or stuffy (congested) nose.  Chest discomfort.  Not wanting to eat as much as normal (poor appetite).  Weakness or feeling tired (fatigue).  Dizziness.  Feeling sick to your stomach (nauseous) or throwing up (vomiting). How is this treated? If the flu is found early, you can be treated with medicine that can help reduce how bad the illness is and how long it lasts (antiviral medicine). This may be given by  mouth (orally) or through an IV tube. Taking care of yourself at home can help your symptoms get better. Your doctor may suggest:  Taking over-the-counter medicines.  Drinking plenty of fluids. The flu often goes away on its own. If you have very bad symptoms or other problems, you may be treated in a hospital. Follow these instructions at home:     Activity  Rest as needed. Get plenty of sleep.  Stay home from work or school as told by your doctor. ? Do not leave home until you do not have  a fever for 24 hours without taking medicine. ? Leave home only to visit your doctor. Eating and drinking  Take an ORS (oral rehydration solution). This is a drink that is sold at pharmacies and stores.  Drink enough fluid to keep your pee (urine) pale yellow.  Drink clear fluids in small amounts as you are able. Clear fluids include: ? Water. ? Ice chips. ? Fruit juice that has water added (diluted fruit juice). ? Low-calorie sports drinks.  Eat bland, easy-to-digest foods in small amounts as you are able. These foods include: ? Bananas. ? Applesauce. ? Rice. ? Lean meats. ? Toast. ? Crackers.  Do not eat or drink: ? Fluids that have a lot of sugar or caffeine. ? Alcohol. ? Spicy or fatty foods. General instructions  Take over-the-counter and prescription medicines only as told by your doctor.  Use a cool mist humidifier to add moisture to the air in your home. This can make it easier for you to breathe.  Cover your mouth and nose when you cough or sneeze.  Wash your hands with soap and water often, especially after you cough or sneeze. If you cannot use soap and water, use alcohol-based hand sanitizer.  Keep all follow-up visits as told by your doctor. This is important. How is this prevented?   Get a flu shot every year. You may get the flu shot in late summer, fall, or winter. Ask your doctor when you should get your flu shot.  Avoid contact with people who are sick during fall and winter (cold and flu season). Contact a doctor if:  You get new symptoms.  You have: ? Chest pain. ? Watery poop (diarrhea). ? A fever.  Your cough gets worse.  You start to have more mucus.  You feel sick to your stomach.  You throw up. Get help right away if you:  Have shortness of breath.  Have trouble breathing.  Have skin or nails that turn a bluish color.  Have very bad pain or stiffness in your neck.  Get a sudden headache.  Get sudden pain in your face or  ear.  Cannot eat or drink without throwing up. Summary  Influenza ("the flu") is an infection in the lungs, nose, and throat. It is caused by a virus.  Take over-the-counter and prescription medicines only as told by your doctor.  Getting a flu shot every year is the best way to avoid getting the flu. This information is not intended to replace advice given to you by your health care provider. Make sure you discuss any questions you have with your health care provider. Document Released: 04/05/2008 Document Revised: 12/13/2017 Document Reviewed: 12/13/2017 Elsevier Interactive Patient Education  2019 [employer].

## 2018-07-31 NOTE — Progress Notes (Signed)
MRN: [id number] DOB: [birth year]  Subjective:   Timothy Pineda is a 48 y.o. male presenting for chief complaint of Headache (STARTED THIS MORNING); Chills (STARTED THIS MORNING); and Generalized Body Aches (STARTED THIS MORNING) .  Reports sudden onset subjective fever, chills, body aches, mild headache, and scratchy throat. Denies sinus pain, ear drainage, inability to swallow, voice change, dry cough, productive cough, wheezing, shortness of breath and chest pain, nausea, vomiting, abdominal pain and diarrhea. Has not tried anything for relief. Has not had anything to drink today except coffee. Has had sick exposure to daughter, just dx with flu, on tamiflu, and coworkers.  No PMH of seasonal allergies, asthma, COPD, autoimmune disorder, DM, or HTN.  Patient has not had flu shot this season. Denies smoking. Denies any other aggravating or relieving factors, no other questions or concerns.  Review of Systems  Eyes: Negative for blurred vision and double vision.  Cardiovascular: Negative for palpitations and leg swelling.  Genitourinary: Negative for hematuria.  Musculoskeletal: Negative for neck pain.  Neurological: Negative for dizziness.    Timothy Pineda has a current medication list which includes the following prescription(s): diclofenac sodium cr and methocarbamol. Also has No Known Allergies.  Timothy Pineda  has no past medical history on file. Also  has no past surgical history on file.   Objective:   Vitals: BP (!) 145/78 (BP Location: Right Arm, Patient Position: Sitting, Cuff Size: Normal)   Pulse 93   Temp 99.2 F (37.3 C) (Oral)   Resp 18   Wt 188 lb (85.3 kg)   SpO2 98%   BMI 29.44 kg/m   Physical Exam Vitals signs reviewed.  Constitutional:      General: He is not in acute distress.    Appearance: He is well-developed.  HENT:     Head: Normocephalic and atraumatic.     Right Ear: Tympanic membrane, ear canal and external ear normal.     Left Ear: Tympanic membrane, ear  canal and external ear normal.     Nose: Nose normal.     Right Sinus: No maxillary sinus tenderness or frontal sinus tenderness.     Left Sinus: No maxillary sinus tenderness or frontal sinus tenderness.     Mouth/Throat:     Lips: Pink.     Mouth: Mucous membranes are moist.     Pharynx: Uvula midline. Posterior oropharyngeal erythema present. No oropharyngeal exudate or uvula swelling.     Tonsils: No tonsillar exudate or tonsillar abscesses. Swelling: 1+ on the right. 1+ on the left.  Eyes:     Extraocular Movements: Extraocular movements intact.     Conjunctiva/sclera: Conjunctivae normal.  Neck:     Musculoskeletal: Normal range of motion.  Cardiovascular:     Rate and Rhythm: Normal rate and regular rhythm.     Heart sounds: Normal heart sounds.  Pulmonary:     Effort: Pulmonary effort is normal.     Breath sounds: Normal breath sounds. No decreased breath sounds, wheezing, rhonchi or rales.  Lymphadenopathy:     Head:     Right side of head: No submental, submandibular, tonsillar, preauricular, posterior auricular or occipital adenopathy.     Left side of head: No submental, submandibular, tonsillar, preauricular, posterior auricular or occipital adenopathy.     Cervical: No cervical adenopathy.     Upper Body:     Right upper body: No supraclavicular adenopathy.     Left upper body: No supraclavicular adenopathy.  Skin:    General: Skin is warm and  dry.  Neurological:     Mental Status: He is alert.     Cranial Nerves: Cranial nerves are intact.        BP Readings from Last 3 Encounters:  07/31/18 (!) 145/78  02/28/16 142/99  11/03/10 143/97   Results for orders placed or performed in visit on 07/31/18 (from the past 24 hour(s))  POCT Influenza A/B     Status: Normal   Collection Time: 07/31/18 12:11 PM  Result Value Ref Range   Influenza A, POC Negative Negative   Influenza B, POC Negative Negative    Assessment and Plan :  1. Influenza-like illness With  hx and recent exposure to influenza, suspect influenza as likely dx despite negative POC flu test. He is overall well appearing, NAD. He is afebrile. Initially tachycardic, but drank ~ 12 oz of water in office pulse decreased to 93 bpm.  Discussed natural history of the disease and treatment options; including supportive care and antiviral therapy.  He is at low risk for serious influenza complications. However, due to his children at home, he would like to have antiviral therapy, Rx provided. Encouraged rest, hydration, and to continue OTC tylenol or ibuprofen as prescribed for discomfort. Educated on proper Special educational needs teacher. Recommended wearing a mask daily especially around other people. Remain out of work until afebrile for 48 hours w/o use of antipyretics. Educated on potential complications of the flu. Advised to follow up in clinic or with family doctor, local urgent care, or ED if develops any of these concerning symptoms or if current symptoms persist outside of discussed boundaries. - oseltamivir (TAMIFLU) 75 MG capsule; Take 1 capsule (75 mg total) by mouth 2 (two) times daily.  Dispense: 10 capsule; Refill: 0 - Benzocaine-Menthol (CEPACOL SORE THROAT) 15-3.6 MG LOZG; Use as directed 1 lozenge in the mouth or throat every 4 (four) hours as needed.  Dispense: 16 each; Refill: 0  2. Elevated blood pressure reading BP mildly elevated in office. No known dx of HTN. In terms of bp, he is asx.  Instructed to check bp outside of office over the next couple of weeks. Encouraged to contact family doctor if values consistently >140/90. Given strict ED precautions.   3. Flu-like symptoms - POCT Influenza A/B   Benjiman Core, PA-C  [facility] 07/31/2018 12:30 PM

## 2018-08-02 DIAGNOSIS — R55 Syncope and collapse: Secondary | ICD-10-CM | POA: Diagnosis not present

## 2018-08-02 DIAGNOSIS — R030 Elevated blood-pressure reading, without diagnosis of hypertension: Secondary | ICD-10-CM | POA: Diagnosis not present

## 2018-08-03 ENCOUNTER — Telehealth: Payer: Self-pay

## 2018-08-03 NOTE — Telephone Encounter (Signed)
Referral sent to scheduling, notes on file.  

## 2018-08-07 ENCOUNTER — Telehealth: Payer: Self-pay | Admitting: *Deleted

## 2018-08-07 NOTE — Telephone Encounter (Signed)
NOTES FAXED TO [facility] FROM [facility] [phone].

## 2018-08-13 ENCOUNTER — Ambulatory Visit: Payer: 59 | Admitting: Cardiology

## 2018-08-13 ENCOUNTER — Encounter: Payer: Self-pay | Admitting: Cardiology

## 2018-08-13 VITALS — BP 143/92 | HR 93 | Ht 67.0 in | Wt 183.0 lb

## 2018-08-13 DIAGNOSIS — R55 Syncope and collapse: Secondary | ICD-10-CM

## 2018-08-13 DIAGNOSIS — R030 Elevated blood-pressure reading, without diagnosis of hypertension: Secondary | ICD-10-CM

## 2018-08-13 NOTE — Patient Instructions (Signed)
Medication Instructions:  Your Physician recommend you continue on your current medication as directed.    If you need a refill on your cardiac medications before your next appointment, please call your pharmacy.   Lab work: None  Testing/Procedures: None  Follow-Up: At [facility], you and your health needs are our priority.  As part of our continuing mission to provide you with exceptional heart care, we have created designated Provider Care Teams.  These Care Teams include your primary Cardiologist (physician) and Advanced Practice Providers (APPs -  Physician Assistants and Nurse Practitioners) who all work together to provide you with the care you need, when you need it. You will need a follow up appointment as needed.  Please call our office 2 months in advance to schedule this appointment.  You may see Dr. Christopher or one of the following Advanced Practice Providers on your designated Care Team:   Rhonda Barrett, PA-C . Kathryn Lawrence, DNP, ANP     

## 2018-08-13 NOTE — Progress Notes (Signed)
Cardiology Office Note:    Date:  08/13/2018   ID:  Timothy SchatzGregory J Lavallee, DOB 1971-03-13, MRN [id number]  PCP:  Gwenlyn FoundEksir, Samantha A, MD  Cardiologist:  Jodelle RedBridgette Janene Yousuf, MD PhD  Referring MD: Gwenlyn FoundEksir, Samantha A, MD   CC: syncope  History of Present Illness:    Timothy Pineda is a 48 y.o. male without significant PMH who is seen as a new consult at the request of Eksir, Nira RetortSamantha A, MD for the evaluation and management of syncope, hypertension. No notes received, only ECG sent via fax.  Patient concerns today: passing out when ill, high blood pressure when he wasn't feeling well, wants to make sure everything is ok.  Cardiac HPI: -Initial onset: Daughter had been exposed to the flu; the following day, he felt achy. Went to PCP, was told he didn't have the flu but was given tamiflu anyway. Went home, rested. Went to kitchen to make soup, sat at the table. Noted that he had a bad headache; wife got up to get him headache medication and saw him slump. Passed out once for a few seconds, then woke up, then passed out again. Seemed somewhat confused immediately after, but was more alert when the EMTs arrived. Had not been eating or drinking well that day, felt dehydrated.   Has never happened before or since. EMTs noted that his neck and back were stiff (notes that he has had this for 20 years), was told it might be meningitis and should watch it. Didn't want to go to ER to be seen. BP on arrival with EMTs was 180/110, wife checked again shortly after and it was in the 130s systolic.   Rested the rest of the night, drank some water. Saw PCP about two days later and had recovered.   -Prior cardiac history: none, though was referred to cardiologist in the distant past for abnormal ECG. Has been stepped on by a bull in a rodeo, was told he didn't have a pulse for a bit (mid-1990s). Also was hit in the chest playing football. -Prior workup: none -Caffeine: just coffee in the AM -Alcohol: little to  none -Tobacco: none -OTC supplements: none -Exercise level: is a Therapist, occupationalsoftball coach, does a lot of running with them, no symptoms. -Cardiac ROS: no chest pain, no shortness of breath, no PND, no orthopnea, no LE edema. -Family history: mat gma and mother both had/have many diseases--HTN, HLD, pacemakers. Dad has HTN and HLD. No known MI or CVA. No history of sudden cardiac death.  History reviewed. No pertinent past medical history.  History reviewed. No pertinent surgical history.  Current Medications: No current outpatient medications on file prior to visit.   No current facility-administered medications on file prior to visit.      Allergies:   Patient has no known allergies.   Social History   Socioeconomic History  . Marital status: Married    Spouse name: Not on file  . Number of children: Not on file  . Years of education: Not on file  . Highest education level: Not on file  Occupational History  . Not on file  Social Needs  . Financial resource strain: Not on file  . Food insecurity:    Worry: Not on file    Inability: Not on file  . Transportation needs:    Medical: Not on file    Non-medical: Not on file  Tobacco Use  . Smoking status: Never Smoker  . Smokeless tobacco: Never Used  Substance and Sexual  Activity  . Alcohol use: Yes  . Drug use: No  . Sexual activity: Not on file  Lifestyle  . Physical activity:    Days per week: Not on file    Minutes per session: Not on file  . Stress: Not on file  Relationships  . Social connections:    Talks on phone: Not on file    Gets together: Not on file    Attends religious service: Not on file    Active member of club or organization: Not on file    Attends meetings of clubs or organizations: Not on file    Relationship status: Not on file  Other Topics Concern  . Not on file  Social History Narrative  . Not on file     Family History: The patient's family history includes Atrial fibrillation in his  maternal grandmother and mother; Diabetes in his maternal grandmother and mother; Heart failure in his maternal grandmother and mother; Hyperlipidemia in his maternal grandmother and mother; Hypertension in his father, maternal grandmother, mother, and sister.  ROS:   Please see the history of present illness.  Additional pertinent ROS:  Constitutional: Negative for chills, fever, night sweats (since recovering from illness), no unintentional weight loss  HENT: Negative for ear pain and hearing loss.   Eyes: Negative for loss of vision and eye pain.  Respiratory: Negative for cough, sputum, shortness of breath, wheezing.   Cardiovascular: See HPI. Gastrointestinal: Negative for abdominal pain, melena, and hematochezia.  Genitourinary: Negative for dysuria and hematuria.  Musculoskeletal: Negative for falls and myalgias.  Skin: Negative for itching and rash.  Neurological: Negative for focal weakness, focal sensory changes and loss of consciousness.  Endo/Heme/Allergies: Does not bruise/bleed easily.    EKGs/Labs/Other Studies Reviewed:    The following studies were reviewed today: No prior cardiac studies Reviewed notes in care everywhere  EKG:  EKG is personally reviewed.  The ekg ordered today demonstrates NSR, nonspecific TWI similar to that seen on EMS tracing faxed over. ECG in 2017 did have upright T waves in lateral leads.  Recent Labs: No results found for requested labs within last 8760 hours.  Recent Lipid Panel No results found for: CHOL, TRIG, HDL, CHOLHDL, VLDL, LDLCALC, LDLDIRECT  Physical Exam:    VS:  BP (!) 143/92 (BP Location: Left Arm, Patient Position: Sitting, Cuff Size: Normal)   Pulse 93   Ht 5\' 7"  (1.702 m)   Wt 183 lb (83 kg)   BMI 28.66 kg/m     Wt Readings from Last 3 Encounters:  08/13/18 183 lb (83 kg)  07/31/18 188 lb (85.3 kg)  02/27/16 170 lb (77.1 kg)     GEN: Well nourished, well developed in no acute distress HEENT: Normal NECK: No  JVD; No carotid bruits LYMPHATICS: No lymphadenopathy CARDIAC: regular rhythm, normal S1 and S2, no murmurs, rubs, gallops. Radial and DP pulses 2+ bilaterally. RESPIRATORY:  Clear to auscultation without rales, wheezing or rhonchi  ABDOMEN: Soft, non-tender, non-distended MUSCULOSKELETAL:  No edema; No deformity  SKIN: Warm and dry NEUROLOGIC:  Alert and oriented x 3 PSYCHIATRIC:  Normal affect   ASSESSMENT:    1. Syncope, unspecified syncope type   2. Elevated blood pressure reading in office without diagnosis of hypertension    PLAN:    1. Syncope: no high risk features, no abnormalities noted on exam. Does have lateral TWI both on prior ECG from EMS and today, but no chest pain. Was normal in 2017. I suspect this was  reflex syncope/vasovagal syncope due to acute illness. He has not had an episode before or since. Discussed potential for evaluation with echo vs. Watchful monitoring. He elects to monitor, but if he has another episode of syncope or presyncope, we will re-evaluate  2. Elevated blood pressure without prior diagnosis of hypertension: reports that he is nervous today. Was very elevated on EMS arrival, while he was feeling poorly, then returned to normal. Recommend monitoring. He does not have diabetes or cardiac risk factors that warrant tight control at this time. Continue with exercise, avoid salt, follow heart healthy diet. If still elevated, then would consider low dose antihypertensive in the future. He plans to follow with his PCP.  Plan for follow up: as needed, instructed on return precautions and reason to call.  Medication Adjustments/Labs and Tests Ordered: Current medicines are reviewed at length with the patient today.  Concerns regarding medicines are outlined above.  Orders Placed This Encounter  Procedures  . EKG 12-Lead   No orders of the defined types were placed in this encounter.   Patient Instructions  Medication Instructions:  Your Physician  recommend you continue on your current medication as directed.    If you need a refill on your cardiac medications before your next appointment, please call your pharmacy.   Lab work: None  Testing/Procedures: None  Follow-Up: At [facility], you and your health needs are our priority.  As part of our continuing mission to provide you with exceptional heart care, we have created designated Provider Care Teams.  These Care Teams include your primary Cardiologist (physician) and Advanced Practice Providers (APPs -  Physician Assistants and Nurse Practitioners) who all work together to provide you with the care you need, when you need it. You will need a follow up appointment as needed.  Please call our office 2 months in advance to schedule this appointment.  You may see Dr. Cristal Deer or one of the following Advanced Practice Providers on your designated Care Team:   Theodore Demark, PA-C . Joni Reining, DNP, ANP        Signed, Jodelle Red, MD PhD 08/13/2018 5:19 PM    [facility]

## 2019-01-30 DIAGNOSIS — R799 Abnormal finding of blood chemistry, unspecified: Secondary | ICD-10-CM | POA: Diagnosis not present

## 2019-01-30 DIAGNOSIS — Z0000 Encounter for general adult medical examination without abnormal findings: Secondary | ICD-10-CM | POA: Diagnosis not present

## 2019-05-15 DIAGNOSIS — M9903 Segmental and somatic dysfunction of lumbar region: Secondary | ICD-10-CM | POA: Diagnosis not present

## 2019-05-15 DIAGNOSIS — M5411 Radiculopathy, occipito-atlanto-axial region: Secondary | ICD-10-CM | POA: Diagnosis not present

## 2019-05-15 DIAGNOSIS — M9901 Segmental and somatic dysfunction of cervical region: Secondary | ICD-10-CM | POA: Diagnosis not present

## 2019-05-20 DIAGNOSIS — M5411 Radiculopathy, occipito-atlanto-axial region: Secondary | ICD-10-CM | POA: Diagnosis not present

## 2019-05-20 DIAGNOSIS — M9901 Segmental and somatic dysfunction of cervical region: Secondary | ICD-10-CM | POA: Diagnosis not present

## 2019-05-20 DIAGNOSIS — M9903 Segmental and somatic dysfunction of lumbar region: Secondary | ICD-10-CM | POA: Diagnosis not present

## 2019-05-23 DIAGNOSIS — M5411 Radiculopathy, occipito-atlanto-axial region: Secondary | ICD-10-CM | POA: Diagnosis not present

## 2019-05-23 DIAGNOSIS — M9901 Segmental and somatic dysfunction of cervical region: Secondary | ICD-10-CM | POA: Diagnosis not present

## 2019-05-23 DIAGNOSIS — M9903 Segmental and somatic dysfunction of lumbar region: Secondary | ICD-10-CM | POA: Diagnosis not present

## 2019-05-27 DIAGNOSIS — M9901 Segmental and somatic dysfunction of cervical region: Secondary | ICD-10-CM | POA: Diagnosis not present

## 2019-05-27 DIAGNOSIS — M5411 Radiculopathy, occipito-atlanto-axial region: Secondary | ICD-10-CM | POA: Diagnosis not present

## 2019-05-27 DIAGNOSIS — M9903 Segmental and somatic dysfunction of lumbar region: Secondary | ICD-10-CM | POA: Diagnosis not present

## 2019-05-30 DIAGNOSIS — M9901 Segmental and somatic dysfunction of cervical region: Secondary | ICD-10-CM | POA: Diagnosis not present

## 2019-05-30 DIAGNOSIS — M5411 Radiculopathy, occipito-atlanto-axial region: Secondary | ICD-10-CM | POA: Diagnosis not present

## 2019-05-30 DIAGNOSIS — M9903 Segmental and somatic dysfunction of lumbar region: Secondary | ICD-10-CM | POA: Diagnosis not present

## 2019-06-03 DIAGNOSIS — M9903 Segmental and somatic dysfunction of lumbar region: Secondary | ICD-10-CM | POA: Diagnosis not present

## 2019-06-03 DIAGNOSIS — M9901 Segmental and somatic dysfunction of cervical region: Secondary | ICD-10-CM | POA: Diagnosis not present

## 2019-06-03 DIAGNOSIS — M5411 Radiculopathy, occipito-atlanto-axial region: Secondary | ICD-10-CM | POA: Diagnosis not present

## 2019-06-05 DIAGNOSIS — M5411 Radiculopathy, occipito-atlanto-axial region: Secondary | ICD-10-CM | POA: Diagnosis not present

## 2019-06-05 DIAGNOSIS — M9901 Segmental and somatic dysfunction of cervical region: Secondary | ICD-10-CM | POA: Diagnosis not present

## 2019-06-05 DIAGNOSIS — M9903 Segmental and somatic dysfunction of lumbar region: Secondary | ICD-10-CM | POA: Diagnosis not present

## 2019-06-10 DIAGNOSIS — M5411 Radiculopathy, occipito-atlanto-axial region: Secondary | ICD-10-CM | POA: Diagnosis not present

## 2019-06-10 DIAGNOSIS — M9901 Segmental and somatic dysfunction of cervical region: Secondary | ICD-10-CM | POA: Diagnosis not present

## 2019-06-10 DIAGNOSIS — M9903 Segmental and somatic dysfunction of lumbar region: Secondary | ICD-10-CM | POA: Diagnosis not present

## 2019-06-13 DIAGNOSIS — M9901 Segmental and somatic dysfunction of cervical region: Secondary | ICD-10-CM | POA: Diagnosis not present

## 2019-06-13 DIAGNOSIS — M5411 Radiculopathy, occipito-atlanto-axial region: Secondary | ICD-10-CM | POA: Diagnosis not present

## 2019-06-13 DIAGNOSIS — M9903 Segmental and somatic dysfunction of lumbar region: Secondary | ICD-10-CM | POA: Diagnosis not present

## 2019-06-17 DIAGNOSIS — M5411 Radiculopathy, occipito-atlanto-axial region: Secondary | ICD-10-CM | POA: Diagnosis not present

## 2019-06-17 DIAGNOSIS — M9903 Segmental and somatic dysfunction of lumbar region: Secondary | ICD-10-CM | POA: Diagnosis not present

## 2019-06-17 DIAGNOSIS — M9901 Segmental and somatic dysfunction of cervical region: Secondary | ICD-10-CM | POA: Diagnosis not present

## 2019-06-20 DIAGNOSIS — M5411 Radiculopathy, occipito-atlanto-axial region: Secondary | ICD-10-CM | POA: Diagnosis not present

## 2019-06-20 DIAGNOSIS — M9903 Segmental and somatic dysfunction of lumbar region: Secondary | ICD-10-CM | POA: Diagnosis not present

## 2019-06-20 DIAGNOSIS — M9901 Segmental and somatic dysfunction of cervical region: Secondary | ICD-10-CM | POA: Diagnosis not present

## 2019-06-24 DIAGNOSIS — M9903 Segmental and somatic dysfunction of lumbar region: Secondary | ICD-10-CM | POA: Diagnosis not present

## 2019-06-24 DIAGNOSIS — M9901 Segmental and somatic dysfunction of cervical region: Secondary | ICD-10-CM | POA: Diagnosis not present

## 2019-06-24 DIAGNOSIS — M5411 Radiculopathy, occipito-atlanto-axial region: Secondary | ICD-10-CM | POA: Diagnosis not present

## 2019-07-01 DIAGNOSIS — M9901 Segmental and somatic dysfunction of cervical region: Secondary | ICD-10-CM | POA: Diagnosis not present

## 2019-07-01 DIAGNOSIS — M9903 Segmental and somatic dysfunction of lumbar region: Secondary | ICD-10-CM | POA: Diagnosis not present

## 2019-07-01 DIAGNOSIS — M5411 Radiculopathy, occipito-atlanto-axial region: Secondary | ICD-10-CM | POA: Diagnosis not present

## 2019-07-10 DIAGNOSIS — M9903 Segmental and somatic dysfunction of lumbar region: Secondary | ICD-10-CM | POA: Diagnosis not present

## 2019-07-10 DIAGNOSIS — M5411 Radiculopathy, occipito-atlanto-axial region: Secondary | ICD-10-CM | POA: Diagnosis not present

## 2019-07-10 DIAGNOSIS — M9901 Segmental and somatic dysfunction of cervical region: Secondary | ICD-10-CM | POA: Diagnosis not present

## 2019-07-30 DIAGNOSIS — M5411 Radiculopathy, occipito-atlanto-axial region: Secondary | ICD-10-CM | POA: Diagnosis not present

## 2019-07-30 DIAGNOSIS — M9901 Segmental and somatic dysfunction of cervical region: Secondary | ICD-10-CM | POA: Diagnosis not present

## 2019-07-30 DIAGNOSIS — M9903 Segmental and somatic dysfunction of lumbar region: Secondary | ICD-10-CM | POA: Diagnosis not present

## 2019-08-06 DIAGNOSIS — M5411 Radiculopathy, occipito-atlanto-axial region: Secondary | ICD-10-CM | POA: Diagnosis not present

## 2019-08-06 DIAGNOSIS — M9901 Segmental and somatic dysfunction of cervical region: Secondary | ICD-10-CM | POA: Diagnosis not present

## 2019-08-06 DIAGNOSIS — M9903 Segmental and somatic dysfunction of lumbar region: Secondary | ICD-10-CM | POA: Diagnosis not present

## 2019-11-11 DIAGNOSIS — M79641 Pain in right hand: Secondary | ICD-10-CM | POA: Diagnosis not present

## 2019-11-11 DIAGNOSIS — I10 Essential (primary) hypertension: Secondary | ICD-10-CM | POA: Diagnosis not present

## 2019-11-11 DIAGNOSIS — M65341 Trigger finger, right ring finger: Secondary | ICD-10-CM | POA: Diagnosis not present

## 2019-11-11 DIAGNOSIS — M542 Cervicalgia: Secondary | ICD-10-CM | POA: Diagnosis not present

## 2019-11-11 DIAGNOSIS — M79642 Pain in left hand: Secondary | ICD-10-CM | POA: Diagnosis not present

## 2019-11-11 MED FILL — MELOXICAM 7.5 MG TABLET: 7.5 | 30 days supply | Qty: 30 | Fill #0

## 2019-11-13 DIAGNOSIS — M79641 Pain in right hand: Secondary | ICD-10-CM | POA: Diagnosis not present

## 2019-11-13 DIAGNOSIS — M79642 Pain in left hand: Secondary | ICD-10-CM | POA: Diagnosis not present

## 2019-11-13 DIAGNOSIS — M65341 Trigger finger, right ring finger: Secondary | ICD-10-CM | POA: Diagnosis not present

## 2019-11-13 DIAGNOSIS — G5622 Lesion of ulnar nerve, left upper limb: Secondary | ICD-10-CM | POA: Diagnosis not present

## 2019-11-13 DIAGNOSIS — G5603 Carpal tunnel syndrome, bilateral upper limbs: Secondary | ICD-10-CM | POA: Diagnosis not present

## 2019-11-13 DIAGNOSIS — G5621 Lesion of ulnar nerve, right upper limb: Secondary | ICD-10-CM | POA: Diagnosis not present

## 2019-12-04 DIAGNOSIS — G5603 Carpal tunnel syndrome, bilateral upper limbs: Secondary | ICD-10-CM | POA: Diagnosis not present

## 2019-12-05 ENCOUNTER — Other Ambulatory Visit: Payer: Self-pay | Admitting: Orthopedic Surgery

## 2019-12-27 ENCOUNTER — Encounter (HOSPITAL_BASED_OUTPATIENT_CLINIC_OR_DEPARTMENT_OTHER): Payer: Self-pay | Admitting: Orthopedic Surgery

## 2019-12-27 ENCOUNTER — Other Ambulatory Visit: Payer: Self-pay

## 2019-12-31 ENCOUNTER — Other Ambulatory Visit (HOSPITAL_COMMUNITY)
Admission: RE | Admit: 2019-12-31 | Discharge: 2019-12-31 | Disposition: A | Discharge status: Home / Self Care | Payer: 59 | Source: Ambulatory Visit | Attending: Orthopedic Surgery | Admitting: Orthopedic Surgery

## 2019-12-31 DIAGNOSIS — Z20822 Contact with and (suspected) exposure to covid-19: Secondary | ICD-10-CM | POA: Insufficient documentation

## 2019-12-31 DIAGNOSIS — Z01812 Encounter for preprocedural laboratory examination: Secondary | ICD-10-CM | POA: Insufficient documentation

## 2019-12-31 LAB — SARS CORONAVIRUS 2 (TAT 6-24 HRS): SARS Coronavirus 2: NEGATIVE

## 2020-01-03 ENCOUNTER — Encounter (HOSPITAL_BASED_OUTPATIENT_CLINIC_OR_DEPARTMENT_OTHER): Payer: Self-pay | Admitting: Orthopedic Surgery

## 2020-01-03 ENCOUNTER — Ambulatory Visit (HOSPITAL_BASED_OUTPATIENT_CLINIC_OR_DEPARTMENT_OTHER): Payer: 59 | Admitting: Anesthesiology

## 2020-01-03 ENCOUNTER — Other Ambulatory Visit: Payer: Self-pay

## 2020-01-03 ENCOUNTER — Ambulatory Visit (HOSPITAL_BASED_OUTPATIENT_CLINIC_OR_DEPARTMENT_OTHER)
Admission: RE | Admit: 2020-01-03 | Discharge: 2020-01-03 | Disposition: A | Discharge status: Home / Self Care | Payer: 59 | Attending: Orthopedic Surgery | Admitting: Orthopedic Surgery

## 2020-01-03 ENCOUNTER — Encounter (HOSPITAL_BASED_OUTPATIENT_CLINIC_OR_DEPARTMENT_OTHER)
Admission: RE | Disposition: A | Discharge status: Home / Self Care | Payer: Self-pay | Source: Home / Self Care | Attending: Orthopedic Surgery

## 2020-01-03 DIAGNOSIS — G5602 Carpal tunnel syndrome, left upper limb: Secondary | ICD-10-CM | POA: Diagnosis not present

## 2020-01-03 DIAGNOSIS — Z8349 Family history of other endocrine, nutritional and metabolic diseases: Secondary | ICD-10-CM | POA: Insufficient documentation

## 2020-01-03 DIAGNOSIS — Z8249 Family history of ischemic heart disease and other diseases of the circulatory system: Secondary | ICD-10-CM | POA: Insufficient documentation

## 2020-01-03 DIAGNOSIS — Z833 Family history of diabetes mellitus: Secondary | ICD-10-CM | POA: Diagnosis not present

## 2020-01-03 HISTORY — DX: Carpal tunnel syndrome, unspecified upper limb: G56.00

## 2020-01-03 HISTORY — PX: CARPAL TUNNEL RELEASE: SHX101

## 2020-01-03 SURGERY — CARPAL TUNNEL RELEASE
Anesthesia: General | Site: Hand | Laterality: Left

## 2020-01-03 MED ORDER — CEFAZOLIN SODIUM-DEXTROSE 2-4 GM/100ML-% IV SOLN
2.0000 g | INTRAVENOUS | Status: AC
  Administered 2020-01-03: 2 g via INTRAVENOUS

## 2020-01-03 MED ORDER — CEFAZOLIN SODIUM-DEXTROSE 2-4 GM/100ML-% IV SOLN
INTRAVENOUS | Status: AC
  Filled 2020-01-03: qty 100

## 2020-01-03 MED ORDER — LIDOCAINE 2% (20 MG/ML) 5 ML SYRINGE
INTRAMUSCULAR | Status: AC
  Filled 2020-01-03: qty 10

## 2020-01-03 MED ORDER — FENTANYL CITRATE (PF) 100 MCG/2ML IJ SOLN: 25.0000 ug | INTRAMUSCULAR | Status: DC | PRN

## 2020-01-03 MED ORDER — ACETAMINOPHEN 500 MG PO TABS
ORAL_TABLET | ORAL | Status: AC
  Filled 2020-01-03: qty 2

## 2020-01-03 MED ORDER — PROPOFOL 10 MG/ML IV BOLUS
INTRAVENOUS | Status: AC
  Filled 2020-01-03: qty 20

## 2020-01-03 MED ORDER — ACETAMINOPHEN 500 MG PO TABS
1000.0000 mg | ORAL_TABLET | Freq: Once | ORAL | Status: AC
  Administered 2020-01-03: 1000 mg via ORAL

## 2020-01-03 MED ORDER — LIDOCAINE 2% (20 MG/ML) 5 ML SYRINGE
INTRAMUSCULAR | Status: AC
  Filled 2020-01-03: qty 5

## 2020-01-03 MED ORDER — ONDANSETRON HCL 4 MG/2ML IJ SOLN
INTRAMUSCULAR | Status: DC | PRN
  Administered 2020-01-03: 4 mg via INTRAVENOUS

## 2020-01-03 MED ORDER — FENTANYL CITRATE (PF) 100 MCG/2ML IJ SOLN
INTRAMUSCULAR | Status: AC
  Filled 2020-01-03: qty 2

## 2020-01-03 MED ORDER — ONDANSETRON HCL 4 MG/2ML IJ SOLN
INTRAMUSCULAR | Status: AC
  Filled 2020-01-03: qty 2

## 2020-01-03 MED ORDER — FENTANYL CITRATE (PF) 250 MCG/5ML IJ SOLN
INTRAMUSCULAR | Status: DC | PRN
  Administered 2020-01-03 (×3): 25 ug via INTRAVENOUS

## 2020-01-03 MED ORDER — BUPIVACAINE HCL (PF) 0.5 % IJ SOLN
INTRAMUSCULAR | Status: DC | PRN
  Administered 2020-01-03: 7 mL

## 2020-01-03 MED ORDER — DEXAMETHASONE SODIUM PHOSPHATE 10 MG/ML IJ SOLN
INTRAMUSCULAR | Status: DC | PRN
  Administered 2020-01-03: 4 mg via INTRAVENOUS

## 2020-01-03 MED ORDER — LIDOCAINE 2% (20 MG/ML) 5 ML SYRINGE
INTRAMUSCULAR | Status: DC | PRN
  Administered 2020-01-03: 60 mg via INTRAVENOUS

## 2020-01-03 MED ORDER — MIDAZOLAM HCL 2 MG/2ML IJ SOLN
INTRAMUSCULAR | Status: AC
  Filled 2020-01-03: qty 2

## 2020-01-03 MED ORDER — 0.9 % SODIUM CHLORIDE (POUR BTL) OPTIME
TOPICAL | Status: DC | PRN
  Administered 2020-01-03: 200 mL

## 2020-01-03 MED ORDER — PROPOFOL 10 MG/ML IV BOLUS
INTRAVENOUS | Status: DC | PRN
  Administered 2020-01-03: 200 mg via INTRAVENOUS

## 2020-01-03 MED ORDER — HYDROCODONE-ACETAMINOPHEN 5-325 MG PO TABS: ORAL_TABLET | ORAL | 0 refills | Status: DC

## 2020-01-03 MED ORDER — LACTATED RINGERS IV SOLN: INTRAVENOUS | Status: DC

## 2020-01-03 MED FILL — HYDROCODON-APAP 5-325: 5-325 | 5 days supply | Qty: 20 | Fill #0

## 2020-01-03 SURGICAL SUPPLY — 39 items
APL PRP STRL LF DISP 70% ISPRP (MISCELLANEOUS) ×1
BLADE SURG 15 STRL LF DISP TIS (BLADE) ×2 IMPLANT
BLADE SURG 15 STRL SS (BLADE) ×6
BNDG ELASTIC 3X5.8 VLCR STR LF (GAUZE/BANDAGES/DRESSINGS) ×3 IMPLANT
BNDG ESMARK 4X9 LF (GAUZE/BANDAGES/DRESSINGS) ×3 IMPLANT
BNDG GAUZE ELAST 4 BULKY (GAUZE/BANDAGES/DRESSINGS) ×3 IMPLANT
CHLORAPREP W/TINT 26 (MISCELLANEOUS) ×3 IMPLANT
CORD BIPOLAR FORCEPS 12FT (ELECTRODE) ×3 IMPLANT
COVER BACK TABLE 60X90IN (DRAPES) ×3 IMPLANT
COVER MAYO STAND STRL (DRAPES) ×3 IMPLANT
COVER WAND RF STERILE (DRAPES) IMPLANT
CUFF TOURN SGL QUICK 18X4 (TOURNIQUET CUFF) ×3 IMPLANT
DRAPE EXTREMITY T 121X128X90 (DISPOSABLE) ×3 IMPLANT
DRAPE SURG 17X23 STRL (DRAPES) ×3 IMPLANT
DRSG PAD ABDOMINAL 8X10 ST (GAUZE/BANDAGES/DRESSINGS) ×3 IMPLANT
GAUZE SPONGE 4X4 12PLY STRL (GAUZE/BANDAGES/DRESSINGS) ×3 IMPLANT
GAUZE XEROFORM 1X8 LF (GAUZE/BANDAGES/DRESSINGS) ×3 IMPLANT
GLOVE BIO SURGEON STRL SZ7.5 (GLOVE) ×3 IMPLANT
GLOVE BIOGEL PI IND STRL 6.5 (GLOVE) ×1 IMPLANT
GLOVE BIOGEL PI IND STRL 7.0 (GLOVE) ×1 IMPLANT
GLOVE BIOGEL PI IND STRL 8 (GLOVE) ×1 IMPLANT
GLOVE BIOGEL PI INDICATOR 6.5 (GLOVE) ×2
GLOVE BIOGEL PI INDICATOR 7.0 (GLOVE) ×2
GLOVE BIOGEL PI INDICATOR 8 (GLOVE) ×2
GLOVE ECLIPSE 6.5 STRL STRAW (GLOVE) ×3 IMPLANT
GOWN STRL REUS W/ TWL LRG LVL3 (GOWN DISPOSABLE) ×1 IMPLANT
GOWN STRL REUS W/TWL LRG LVL3 (GOWN DISPOSABLE) ×3
GOWN STRL REUS W/TWL XL LVL3 (GOWN DISPOSABLE) ×3 IMPLANT
NEEDLE HYPO 25X1 1.5 SAFETY (NEEDLE) ×3 IMPLANT
NS IRRIG 1000ML POUR BTL (IV SOLUTION) ×3 IMPLANT
PADDING CAST ABS 4INX4YD NS (CAST SUPPLIES)
PADDING CAST ABS COTTON 4X4 ST (CAST SUPPLIES) IMPLANT
SET BASIN DAY SURGERY F.S. (CUSTOM PROCEDURE TRAY) ×3 IMPLANT
STOCKINETTE 4X48 STRL (DRAPES) ×3 IMPLANT
SUT ETHILON 4 0 PS 2 18 (SUTURE) ×3 IMPLANT
SYR BULB EAR ULCER 3OZ GRN STR (SYRINGE) ×3 IMPLANT
SYR CONTROL 10ML LL (SYRINGE) ×3 IMPLANT
TOWEL GREEN STERILE FF (TOWEL DISPOSABLE) ×6 IMPLANT
UNDERPAD 30X36 HEAVY ABSORB (UNDERPADS AND DIAPERS) ×3 IMPLANT

## 2020-01-03 NOTE — Op Note (Signed)
01/03/2020 [facility]                              OPERATIVE REPORT   PREOPERATIVE DIAGNOSIS:  Left carpal tunnel syndrome.  POSTOPERATIVE DIAGNOSIS:  Left carpal tunnel syndrome.  PROCEDURE:  Left carpal tunnel release.  SURGEON:  Leanora Cover, MD  ASSISTANT:  none.  ANESTHESIA: General  IV FLUIDS:  Per anesthesia flow sheet.  ESTIMATED BLOOD LOSS:  Minimal.  COMPLICATIONS:  None.  SPECIMENS:  None.  TOURNIQUET TIME:    Total Tourniquet Time Documented: Upper Arm (Left) - 17 minutes Total: Upper Arm (Left) - 17 minutes   DISPOSITION:  Stable to PACU.  LOCATION: [facility]  INDICATIONS:  49 yo male with numbness and tingling left hand.  Positive nerve conduction studies.  He wishes to have a carpal tunnel release for management of his symptoms.  Risks, benefits and alternatives of surgery were discussed including the risk of blood loss; infection; damage to nerves, vessels, tendons, ligaments, bone; failure of surgery; need for additional surgery; complications with wound healing; continued pain; recurrence of carpal tunnel syndrome; and damage to motor branch. He voiced understanding of these risks and elected to proceed.   OPERATIVE COURSE:  After being identified preoperatively by myself, the patient and I agreed upon the procedure and site of procedure.  The surgical site was marked.  The risks, benefits, and alternatives of the surgery were reviewed and he wished to proceed.  Surgical consent had been signed.  He was given IV Ancef as preoperative antibiotic prophylaxis.  He was transferred to the operating room and placed on the operating room table in supine position with the Left upper extremity on an armboard.  General anesthesia was induced by the anesthesiologist.  Left upper extremity was prepped and draped in normal sterile orthopaedic fashion.  A surgical pause was performed between the surgeons, anesthesia, and operating room staff,  and all were in agreement as to the patient, procedure, and site of procedure.  Tourniquet at the proximal aspect of the extremity was inflated to 250 mmHg after exsanguination of the arm with an Esmarch bandage  Incision was made over the transverse carpal ligament and carried into the subcutaneous tissues by spreading technique.  Bipolar electrocautery was used to obtain hemostasis.  The palmar fascia was sharply incised.  The transverse carpal ligament was identified and sharply incised.  It was incised distally first.  The flexor tendons were identified.  The flexor tendon to the ring finger was identified and retracted radially.  The transverse carpal ligament was then incised proximally.  Scissors were used to split the distal aspect of the volar antebrachial fascia.  A finger was placed into the wound to ensure complete decompression, which was the case.  The nerve was examined.  There was an hourglass deformity.  The motor branch was identified and was intact.  The wound was copiously irrigated with sterile saline.  It was then closed with 4-0 nylon in a horizontal mattress fashion.  It was injected with 0.25% plain Marcaine to aid in postoperative analgesia.  It was dressed with sterile Xeroform, 4x4s, an ABD, and wrapped with Kerlix and an Ace bandage.  Tourniquet was deflated at 17 minutes.  Fingertips were pink with brisk capillary refill after deflation of the tourniquet.  Operative drapes were broken down.  The patient was awoken from anesthesia safely.  He was transferred back to stretcher and  taken to the PACU in stable condition.  I will see him back in the office in 1 week for postoperative followup.  I will give him a prescription for Norco 5/325 1-2 tabs PO q6 hours prn pain, dispense # 20.    Betha Loa, MD Electronically signed, 01/03/20

## 2020-01-03 NOTE — Transfer of Care (Signed)
Immediate Anesthesia Transfer of Care Note  Patient: Timothy Pineda  Procedure(s) Performed: CARPAL TUNNEL RELEASE (Left Hand)  Patient Location: PACU  Anesthesia Type:General  Level of Consciousness: awake, alert , oriented and patient cooperative  Airway & Oxygen Therapy: Patient Spontanous Breathing and Patient connected to face mask oxygen  Post-op Assessment: Report given to RN, Post -op Vital signs reviewed and stable and Patient moving all extremities  Post vital signs: Reviewed and stable  Last Vitals:  Vitals Value Taken Time  BP 119/83 01/03/20 1330  Temp    Pulse 77 01/03/20 1331  Resp 9 01/03/20 1331  SpO2 100 % 01/03/20 1331  Vitals shown include unvalidated device data.  Last Pain:  Vitals:   01/03/20 1110  TempSrc: Oral  PainSc: 0-No pain         Complications: No complications documented.

## 2020-01-03 NOTE — Anesthesia Preprocedure Evaluation (Addendum)
Anesthesia Evaluation  Patient identified by MRN, date of birth, ID band Patient awake    Reviewed: Allergy & Precautions, H&P , NPO status , Patient's Chart, lab work & pertinent test results  Airway Mallampati: III  TM Distance: >3 FB Neck ROM: Full    Dental no notable dental hx. (+) Teeth Intact, Dental Advisory Given   Pulmonary neg pulmonary ROS,    Pulmonary exam normal breath sounds clear to auscultation       Cardiovascular negative cardio ROS   Rhythm:Regular Rate:Normal     Neuro/Psych negative neurological ROS  negative psych ROS   GI/Hepatic negative GI ROS, Neg liver ROS,   Endo/Other  negative endocrine ROS  Renal/GU negative Renal ROS  negative genitourinary   Musculoskeletal   Abdominal   Peds  Hematology negative hematology ROS (+)   Anesthesia Other Findings   Reproductive/Obstetrics negative OB ROS                            Anesthesia Physical Anesthesia Plan  ASA: I  Anesthesia Plan: General   Post-op Pain Management:    Induction: Intravenous  PONV Risk Score and Plan: 3 and Ondansetron, Dexamethasone and Midazolam  Airway Management Planned: LMA  Additional Equipment:   Intra-op Plan:   Post-operative Plan: Extubation in OR  Informed Consent: I have reviewed the patients History and Physical, chart, labs and discussed the procedure including the risks, benefits and alternatives for the proposed anesthesia with the patient or authorized representative who has indicated his/her understanding and acceptance.     Dental advisory given  Plan Discussed with: CRNA  Anesthesia Plan Comments:         Anesthesia Quick Evaluation  

## 2020-01-03 NOTE — Anesthesia Postprocedure Evaluation (Signed)
Anesthesia Post Note  Patient: Timothy Pineda  Procedure(s) Performed: CARPAL TUNNEL RELEASE (Left Hand)     Patient location during evaluation: PACU Anesthesia Type: General Level of consciousness: awake and alert Pain management: pain level controlled Vital Signs Assessment: post-procedure vital signs reviewed and stable Respiratory status: spontaneous breathing, nonlabored ventilation and respiratory function stable Cardiovascular status: blood pressure returned to baseline and stable Postop Assessment: no apparent nausea or vomiting Anesthetic complications: no   No complications documented.  Last Vitals:  Vitals:   01/03/20 1345 01/03/20 1400  BP: (!) 128/97 124/81  Pulse: 86 75  Resp: 14 16  Temp:    SpO2: 100% 100%    Last Pain:  Vitals:   01/03/20 1400  TempSrc:   PainSc: 0-No pain                 Avia Merkley,W. EDMOND

## 2020-01-03 NOTE — H&P (Signed)
Timothy Pineda is an 49 y.o. male.   Chief Complaint: carpal tunnel syndrome HPI: 49 yo male with numbness and tingling in left hand.  Positive nerve conduction studies.  He wishes to have left carpal tunnel release.  Allergies: No Known Allergies  Past Medical History:  Diagnosis Date  . Carpal tunnel syndrome    left    Past Surgical History:  Procedure Laterality Date  . ANKLE SURGERY Right     Family History: Family History  Problem Relation Age of Onset  . Atrial fibrillation Mother   . Diabetes Mother   . Heart failure Mother   . Hyperlipidemia Mother   . Hypertension Mother   . Hypertension Father   . Hypertension Sister   . Diabetes Maternal Grandmother   . Atrial fibrillation Maternal Grandmother   . Hypertension Maternal Grandmother   . Hyperlipidemia Maternal Grandmother   . Heart failure Maternal Grandmother     Social History:   reports that he has never smoked. He has never used smokeless tobacco. He reports current alcohol use. He reports that he does not use drugs.  Medications: Medications Prior to Admission  Medication Sig Dispense Refill  . Ascorbic Acid (VITAMIN C) 1000 MG tablet Take 500 mg by mouth daily.    Marland Kitchen b complex vitamins capsule Take 1 capsule by mouth daily.    . cholecalciferol (VITAMIN D3) 25 MCG (1000 UNIT) tablet Take 1,000 Units by mouth daily.      No results found for this or any previous visit (from the past 48 hour(s)).  No results found.   A comprehensive review of systems was negative.  Blood pressure (!) 134/94, pulse 78, temperature 98.1 F (36.7 C), temperature source Oral, resp. rate 18, height 5\' 6"  (1.676 m), weight 82.6 kg, SpO2 100 %.  General appearance: alert, cooperative and appears stated age Head: Normocephalic, without obvious abnormality, atraumatic Neck: supple, symmetrical, trachea midline Cardio: regular rate and rhythm Resp: clear to auscultation bilaterally Extremities: Intact sensation and  capillary refill all digits.  +epl/fpl/io.  No wounds.  Pulses: 2+ and symmetric Skin: Skin color, texture, turgor normal. No rashes or lesions Neurologic: Grossly normal Incision/Wound: none  Assessment/Plan Left carpal tunnel syndrome.  Non operative and operative treatment options have been discussed with the patient and patient wishes to proceed with operative treatment. Risks, benefits, and alternatives of surgery have been discussed and the patient agrees with the plan of care.   01/03/2020, 12:17 PM

## 2020-01-03 NOTE — Anesthesia Procedure Notes (Signed)
Procedure Name: LMA Insertion Date/Time: 01/03/2020 12:53 PM Performed by: Lucinda Dell, CRNA Pre-anesthesia Checklist: Patient identified, Emergency Drugs available, Suction available and Patient being monitored Patient Re-evaluated:Patient Re-evaluated prior to induction Oxygen Delivery Method: Circle system utilized Preoxygenation: Pre-oxygenation with 100% oxygen Induction Type: IV induction Ventilation: Mask ventilation without difficulty LMA: LMA inserted LMA Size: 4.0 Number of attempts: 1 Placement Confirmation: positive ETCO2 and breath sounds checked- equal and bilateral Tube secured with: Tape Dental Injury: Teeth and Oropharynx as per pre-operative assessment

## 2020-01-03 NOTE — Discharge Instructions (Addendum)
[facility] Instructions Hand Surgery  Wound Care: Keep your hand elevated above the level of your heart.  Do not allow it to dangle by your side.  Keep the dressing dry and do not remove it unless your doctor advises you to do so.  He will usually change it at the time of your post-op visit.  Moving your fingers is advised to stimulate circulation but will depend on the site of your surgery.  If you have a splint applied, your doctor will advise you regarding movement.  Activity: Do not drive or operate machinery today.  Rest today and then you may return to your normal activity and work as indicated by your physician.  Diet:  Drink liquids today or eat a light diet.  You may resume a regular diet tomorrow.    General expectations: Pain for two to three days. Fingers may become slightly swollen.  Call your doctor if any of the following occur: Severe pain not relieved by pain medication. Elevated temperature. Dressing soaked with blood. Inability to move fingers. White or bluish color to fingers.    NO TYLENOL PRODUCTS UNTIL 5:15 PM     Post Anesthesia Home Care Instructions  Activity: Get plenty of rest for the remainder of the day. A responsible individual must stay with you for 24 hours following the procedure.  For the next 24 hours, DO NOT: -Drive a car -Advertising copywriter -Drink alcoholic beverages -Take any medication unless instructed by your physician -Make any legal decisions or sign important papers.  Meals: Start with liquid foods such as gelatin or soup. Progress to regular foods as tolerated. Avoid greasy, spicy, heavy foods. If nausea and/or vomiting occur, drink only clear liquids until the nausea and/or vomiting subsides. Call your physician if vomiting continues.  Special Instructions/Symptoms: Your throat may feel dry or sore from the anesthesia or the breathing tube placed in your throat during surgery. If this causes discomfort, gargle with warm salt  water. The discomfort should disappear within 24 hours.  If you had a scopolamine patch placed behind your ear for the management of post- operative nausea and/or vomiting:  1. The medication in the patch is effective for 72 hours, after which it should be removed.  Wrap patch in a tissue and discard in the trash. Wash hands thoroughly with soap and water. 2. You may remove the patch earlier than 72 hours if you experience unpleasant side effects which may include dry mouth, dizziness or visual disturbances. 3. Avoid touching the patch. Wash your hands with soap and water after contact with the patch.

## 2020-01-06 ENCOUNTER — Encounter (HOSPITAL_BASED_OUTPATIENT_CLINIC_OR_DEPARTMENT_OTHER): Payer: Self-pay | Admitting: Orthopedic Surgery

## 2020-02-14 ENCOUNTER — Other Ambulatory Visit: Payer: Self-pay | Admitting: Orthopedic Surgery

## 2020-02-27 ENCOUNTER — Encounter (HOSPITAL_BASED_OUTPATIENT_CLINIC_OR_DEPARTMENT_OTHER): Payer: Self-pay | Admitting: Orthopedic Surgery

## 2020-02-27 ENCOUNTER — Other Ambulatory Visit: Payer: Self-pay

## 2020-03-03 ENCOUNTER — Other Ambulatory Visit (HOSPITAL_COMMUNITY)
Admission: RE | Admit: 2020-03-03 | Discharge: 2020-03-03 | Disposition: A | Discharge status: Home / Self Care | Payer: 59 | Source: Ambulatory Visit | Attending: Orthopedic Surgery | Admitting: Orthopedic Surgery

## 2020-03-03 DIAGNOSIS — Z20822 Contact with and (suspected) exposure to covid-19: Secondary | ICD-10-CM | POA: Insufficient documentation

## 2020-03-03 DIAGNOSIS — Z01812 Encounter for preprocedural laboratory examination: Secondary | ICD-10-CM | POA: Diagnosis not present

## 2020-03-03 LAB — SARS CORONAVIRUS 2 (TAT 6-24 HRS): SARS Coronavirus 2: NEGATIVE

## 2020-03-03 NOTE — Progress Notes (Signed)

## 2020-03-06 ENCOUNTER — Encounter (HOSPITAL_BASED_OUTPATIENT_CLINIC_OR_DEPARTMENT_OTHER): Payer: Self-pay | Admitting: Orthopedic Surgery

## 2020-03-06 ENCOUNTER — Ambulatory Visit (HOSPITAL_BASED_OUTPATIENT_CLINIC_OR_DEPARTMENT_OTHER)
Admission: RE | Admit: 2020-03-06 | Discharge: 2020-03-06 | Disposition: A | Discharge status: Home / Self Care | Payer: 59 | Attending: Orthopedic Surgery | Admitting: Orthopedic Surgery

## 2020-03-06 ENCOUNTER — Ambulatory Visit (HOSPITAL_BASED_OUTPATIENT_CLINIC_OR_DEPARTMENT_OTHER): Payer: 59 | Admitting: Anesthesiology

## 2020-03-06 ENCOUNTER — Encounter (HOSPITAL_BASED_OUTPATIENT_CLINIC_OR_DEPARTMENT_OTHER)
Admission: RE | Disposition: A | Discharge status: Home / Self Care | Payer: Self-pay | Source: Home / Self Care | Attending: Orthopedic Surgery

## 2020-03-06 ENCOUNTER — Other Ambulatory Visit: Payer: Self-pay

## 2020-03-06 DIAGNOSIS — G5601 Carpal tunnel syndrome, right upper limb: Secondary | ICD-10-CM | POA: Insufficient documentation

## 2020-03-06 HISTORY — PX: CARPAL TUNNEL RELEASE: SHX101

## 2020-03-06 SURGERY — CARPAL TUNNEL RELEASE
Anesthesia: General | Site: Wrist | Laterality: Right

## 2020-03-06 MED ORDER — HYDROMORPHONE HCL 1 MG/ML IJ SOLN: 0.2500 mg | INTRAMUSCULAR | Status: DC | PRN

## 2020-03-06 MED ORDER — CEFAZOLIN SODIUM-DEXTROSE 2-4 GM/100ML-% IV SOLN
2.0000 g | INTRAVENOUS | Status: AC
  Administered 2020-03-06: 2 g via INTRAVENOUS

## 2020-03-06 MED ORDER — BUPIVACAINE HCL (PF) 0.25 % IJ SOLN
INTRAMUSCULAR | Status: DC | PRN
  Administered 2020-03-06: 10 mL

## 2020-03-06 MED ORDER — PROMETHAZINE HCL 25 MG/ML IJ SOLN: 6.2500 mg | INTRAMUSCULAR | Status: DC | PRN

## 2020-03-06 MED ORDER — ONDANSETRON HCL 4 MG/2ML IJ SOLN
INTRAMUSCULAR | Status: DC | PRN
  Administered 2020-03-06: 4 mg via INTRAVENOUS

## 2020-03-06 MED ORDER — LIDOCAINE 2% (20 MG/ML) 5 ML SYRINGE
INTRAMUSCULAR | Status: AC
  Filled 2020-03-06: qty 5

## 2020-03-06 MED ORDER — ONDANSETRON HCL 4 MG/2ML IJ SOLN
INTRAMUSCULAR | Status: AC
  Filled 2020-03-06: qty 2

## 2020-03-06 MED ORDER — AMISULPRIDE (ANTIEMETIC) 5 MG/2ML IV SOLN: 10.0000 mg | Freq: Once | INTRAVENOUS | Status: DC | PRN

## 2020-03-06 MED ORDER — OXYCODONE HCL 5 MG PO TABS: 5.0000 mg | ORAL_TABLET | Freq: Once | ORAL | Status: DC | PRN

## 2020-03-06 MED ORDER — MEPERIDINE HCL 25 MG/ML IJ SOLN: 6.2500 mg | INTRAMUSCULAR | Status: DC | PRN

## 2020-03-06 MED ORDER — LIDOCAINE 2% (20 MG/ML) 5 ML SYRINGE
INTRAMUSCULAR | Status: DC | PRN
  Administered 2020-03-06: 60 mg via INTRAVENOUS

## 2020-03-06 MED ORDER — MIDAZOLAM HCL 5 MG/5ML IJ SOLN
INTRAMUSCULAR | Status: DC | PRN
  Administered 2020-03-06: 2 mg via INTRAVENOUS

## 2020-03-06 MED ORDER — FENTANYL CITRATE (PF) 100 MCG/2ML IJ SOLN
INTRAMUSCULAR | Status: AC
  Filled 2020-03-06: qty 2

## 2020-03-06 MED ORDER — LACTATED RINGERS IV SOLN: INTRAVENOUS | Status: DC

## 2020-03-06 MED ORDER — OXYCODONE HCL 5 MG/5ML PO SOLN: 5.0000 mg | Freq: Once | ORAL | Status: DC | PRN

## 2020-03-06 MED ORDER — PROPOFOL 10 MG/ML IV BOLUS
INTRAVENOUS | Status: DC | PRN
  Administered 2020-03-06: 170 mg via INTRAVENOUS
  Administered 2020-03-06: 30 mg via INTRAVENOUS

## 2020-03-06 MED ORDER — DEXAMETHASONE SODIUM PHOSPHATE 10 MG/ML IJ SOLN
INTRAMUSCULAR | Status: AC
  Filled 2020-03-06: qty 1

## 2020-03-06 MED ORDER — MIDAZOLAM HCL 2 MG/2ML IJ SOLN
INTRAMUSCULAR | Status: AC
  Filled 2020-03-06: qty 2

## 2020-03-06 MED ORDER — DEXAMETHASONE SODIUM PHOSPHATE 10 MG/ML IJ SOLN
INTRAMUSCULAR | Status: DC | PRN
  Administered 2020-03-06: 5 mg via INTRAVENOUS

## 2020-03-06 MED ORDER — PROPOFOL 10 MG/ML IV BOLUS
INTRAVENOUS | Status: AC
  Filled 2020-03-06: qty 20

## 2020-03-06 MED ORDER — FENTANYL CITRATE (PF) 100 MCG/2ML IJ SOLN
INTRAMUSCULAR | Status: DC | PRN
Start: 2020-03-06 — End: 2020-03-06
  Administered 2020-03-06 (×2): 50 ug via INTRAVENOUS

## 2020-03-06 MED ORDER — CEFAZOLIN SODIUM-DEXTROSE 2-4 GM/100ML-% IV SOLN
INTRAVENOUS | Status: AC
  Filled 2020-03-06: qty 100

## 2020-03-06 SURGICAL SUPPLY — 39 items
APL PRP STRL LF DISP 70% ISPRP (MISCELLANEOUS) ×1
BLADE SURG 15 STRL LF DISP TIS (BLADE) ×2 IMPLANT
BLADE SURG 15 STRL SS (BLADE) ×6
BNDG CMPR 9X4 STRL LF SNTH (GAUZE/BANDAGES/DRESSINGS)
BNDG ELASTIC 3X5.8 VLCR STR LF (GAUZE/BANDAGES/DRESSINGS) ×3 IMPLANT
BNDG ESMARK 4X9 LF (GAUZE/BANDAGES/DRESSINGS) IMPLANT
BNDG GAUZE ELAST 4 BULKY (GAUZE/BANDAGES/DRESSINGS) ×3 IMPLANT
CHLORAPREP W/TINT 26 (MISCELLANEOUS) ×3 IMPLANT
CORD BIPOLAR FORCEPS 12FT (ELECTRODE) ×3 IMPLANT
COVER BACK TABLE 60X90IN (DRAPES) ×3 IMPLANT
COVER MAYO STAND STRL (DRAPES) ×3 IMPLANT
COVER WAND RF STERILE (DRAPES) IMPLANT
CUFF TOURN SGL QUICK 18X4 (TOURNIQUET CUFF) ×3 IMPLANT
DRAPE EXTREMITY T 121X128X90 (DISPOSABLE) ×3 IMPLANT
DRAPE SURG 17X23 STRL (DRAPES) ×3 IMPLANT
DRSG PAD ABDOMINAL 8X10 ST (GAUZE/BANDAGES/DRESSINGS) ×3 IMPLANT
GAUZE SPONGE 4X4 12PLY STRL (GAUZE/BANDAGES/DRESSINGS) ×3 IMPLANT
GAUZE XEROFORM 1X8 LF (GAUZE/BANDAGES/DRESSINGS) ×3 IMPLANT
GLOVE BIO SURGEON STRL SZ 6.5 (GLOVE) ×2 IMPLANT
GLOVE BIO SURGEON STRL SZ7.5 (GLOVE) ×3 IMPLANT
GLOVE BIO SURGEONS STRL SZ 6.5 (GLOVE) ×1
GLOVE BIOGEL PI IND STRL 7.0 (GLOVE) ×2 IMPLANT
GLOVE BIOGEL PI IND STRL 8 (GLOVE) ×1 IMPLANT
GLOVE BIOGEL PI INDICATOR 7.0 (GLOVE) ×4
GLOVE BIOGEL PI INDICATOR 8 (GLOVE) ×2
GOWN STRL REUS W/ TWL LRG LVL3 (GOWN DISPOSABLE) ×1 IMPLANT
GOWN STRL REUS W/TWL LRG LVL3 (GOWN DISPOSABLE) ×3
GOWN STRL REUS W/TWL XL LVL3 (GOWN DISPOSABLE) ×3 IMPLANT
NEEDLE HYPO 25X1 1.5 SAFETY (NEEDLE) ×3 IMPLANT
NS IRRIG 1000ML POUR BTL (IV SOLUTION) ×3 IMPLANT
PACK BASIN DAY SURGERY FS (CUSTOM PROCEDURE TRAY) ×3 IMPLANT
PADDING CAST ABS 4INX4YD NS (CAST SUPPLIES) ×2
PADDING CAST ABS COTTON 4X4 ST (CAST SUPPLIES) ×1 IMPLANT
STOCKINETTE 4X48 STRL (DRAPES) ×3 IMPLANT
SUT ETHILON 4 0 PS 2 18 (SUTURE) ×3 IMPLANT
SYR BULB EAR ULCER 3OZ GRN STR (SYRINGE) ×3 IMPLANT
SYR CONTROL 10ML LL (SYRINGE) ×3 IMPLANT
TOWEL GREEN STERILE FF (TOWEL DISPOSABLE) ×6 IMPLANT
UNDERPAD 30X36 HEAVY ABSORB (UNDERPADS AND DIAPERS) ×3 IMPLANT

## 2020-03-06 NOTE — Op Note (Signed)
03/06/2020 [facility]                              OPERATIVE REPORT   PREOPERATIVE DIAGNOSIS:  Right carpal tunnel syndrome.  POSTOPERATIVE DIAGNOSIS:  Right carpal tunnel syndrome.  PROCEDURE:  Right carpal tunnel release.  SURGEON:  Betha Loa, MD  ASSISTANT:  none.  ANESTHESIA: General  IV FLUIDS:  Per anesthesia flow sheet.  ESTIMATED BLOOD LOSS:  Minimal.  COMPLICATIONS:  None.  SPECIMENS:  None.  TOURNIQUET TIME:    Total Tourniquet Time Documented: Upper Arm (Right) - 16 minutes Total: Upper Arm (Right) - 16 minutes   DISPOSITION:  Stable to PACU.  LOCATION: [facility]  INDICATIONS:  49 yo male with numbness and tingling right hand.  Positive nerve conduction studies.  He wishes to have a carpal tunnel release for management of his symptoms.  Risks, benefits and alternatives of surgery were discussed including the risk of blood loss; infection; damage to nerves, vessels, tendons, ligaments, bone; failure of surgery; need for additional surgery; complications with wound healing; continued pain; recurrence of carpal tunnel syndrome; and damage to motor branch. He voiced understanding of these risks and elected to proceed.   OPERATIVE COURSE:  After being identified preoperatively by myself, the patient and I agreed upon the procedure and site of procedure.  The surgical site was marked.  The risks, benefits, and alternatives of the surgery were reviewed and he wished to proceed.  Surgical consent had been signed.  He was given IV Ancef as preoperative antibiotic prophylaxis.  He was transferred to the operating room and placed on the operating room table in supine position with the Right upper extremity on an armboard.  General anesthesia was induced by the anesthesiologist.  Right upper extremity was prepped and draped in normal sterile orthopaedic fashion.  A surgical pause was performed between the surgeons, anesthesia, and operating room  staff, and all were in agreement as to the patient, procedure, and site of procedure.  Tourniquet at the proximal aspect of the extremity was inflated to 250 mmHg after exsanguination of the arm with an Esmarch bandage  Incision was made over the transverse carpal ligament and carried into the subcutaneous tissues by spreading technique.  Bipolar electrocautery was used to obtain hemostasis.  The palmar fascia was sharply incised.  The transverse carpal ligament was identified and sharply incised.  It was incised distally first.  The flexor tendons were identified.  The flexor tendon to the ring finger was identified and retracted radially.  The transverse carpal ligament was then incised proximally.  Scissors were used to split the distal aspect of the volar antebrachial fascia.  A finger was placed into the wound to ensure complete decompression, which was the case.  The nerve was examined.  It was adherent to the radial leaflet.  The motor branch was identified and was intact.  The wound was copiously irrigated with sterile saline.  It was then closed with 4-0 nylon in a horizontal mattress fashion.  It was injected with 0.25% plain Marcaine to aid in postoperative analgesia.  It was dressed with sterile Xeroform, 4x4s, an ABD, and wrapped with Kerlix and an Ace bandage.  Tourniquet was deflated at 16 minutes.  Fingertips were pink with brisk capillary refill after deflation of the tourniquet.  Operative drapes were broken down.  The patient was awoken from anesthesia safely.  He was transferred back to  stretcher and taken to the PACU in stable condition.  I will see him back in the office in 1 week for postoperative followup.  He states he has pain medication left over from his last surgery and does not need a prescription.    Betha Loa, MD Electronically signed, 03/06/20

## 2020-03-06 NOTE — Anesthesia Postprocedure Evaluation (Signed)
Anesthesia Post Note  Patient: Timothy Pineda  Procedure(s) Performed: CARPAL TUNNEL RELEASE (Right Wrist)     Patient location during evaluation: PACU Anesthesia Type: General Level of consciousness: awake and alert Pain management: pain level controlled Vital Signs Assessment: post-procedure vital signs reviewed and stable Respiratory status: spontaneous breathing, nonlabored ventilation and respiratory function stable Cardiovascular status: blood pressure returned to baseline and stable Postop Assessment: no apparent nausea or vomiting Anesthetic complications: no   No complications documented.  Last Vitals:  Vitals:   03/06/20 1415 03/06/20 1433  BP: 118/88 (!) 131/94  Pulse: 80 74  Resp: (!) 9 16  Temp:  36.6 C  SpO2: 97% 97%    Last Pain:  Vitals:   03/06/20 1433  TempSrc: Oral  PainSc: 0-No pain                 Lowella Curb

## 2020-03-06 NOTE — Anesthesia Procedure Notes (Signed)
Procedure Name: LMA Insertion Date/Time: 03/06/2020 1:23 PM Performed by: Burna Cash, CRNA Pre-anesthesia Checklist: Patient identified, Emergency Drugs available, Suction available and Patient being monitored Patient Re-evaluated:Patient Re-evaluated prior to induction Oxygen Delivery Method: Circle system utilized Preoxygenation: Pre-oxygenation with 100% oxygen Induction Type: IV induction Ventilation: Mask ventilation without difficulty LMA: LMA inserted LMA Size: 5.0 Number of attempts: 1 Airway Equipment and Method: Bite block Placement Confirmation: positive ETCO2 Tube secured with: Tape Dental Injury: Teeth and Oropharynx as per pre-operative assessment

## 2020-03-06 NOTE — Anesthesia Preprocedure Evaluation (Signed)
Anesthesia Evaluation  Patient identified by MRN, date of birth, ID band Patient awake    Reviewed: Allergy & Precautions, H&P , NPO status , Patient's Chart, lab work & pertinent test results  Airway Mallampati: III  TM Distance: >3 FB Neck ROM: Full    Dental no notable dental hx. (+) Teeth Intact, Dental Advisory Given   Pulmonary neg pulmonary ROS,    Pulmonary exam normal breath sounds clear to auscultation       Cardiovascular negative cardio ROS   Rhythm:Regular Rate:Normal     Neuro/Psych negative neurological ROS  negative psych ROS   GI/Hepatic negative GI ROS, Neg liver ROS,   Endo/Other  negative endocrine ROS  Renal/GU negative Renal ROS  negative genitourinary   Musculoskeletal   Abdominal   Peds  Hematology negative hematology ROS (+)   Anesthesia Other Findings   Reproductive/Obstetrics negative OB ROS                             Anesthesia Physical  Anesthesia Plan  ASA: I  Anesthesia Plan: General   Post-op Pain Management:    Induction: Intravenous  PONV Risk Score and Plan: 3 and Ondansetron, Dexamethasone, Midazolam and Treatment may vary due to age or medical condition  Airway Management Planned: LMA  Additional Equipment:   Intra-op Plan:   Post-operative Plan: Extubation in OR  Informed Consent: I have reviewed the patients History and Physical, chart, labs and discussed the procedure including the risks, benefits and alternatives for the proposed anesthesia with the patient or authorized representative who has indicated his/her understanding and acceptance.     Dental advisory given  Plan Discussed with: CRNA  Anesthesia Plan Comments:         Anesthesia Quick Evaluation

## 2020-03-06 NOTE — H&P (Signed)
°  Timothy Pineda is an 49 y.o. male.   Chief Complaint: carpal tunnel syndrome HPI: 49 yo male with numbness and tingling right hand.  Nocturnal symptoms.  Positive nerve conduction studies.  He wishes to have right carpal tunnel release.  Allergies: No Known Allergies  Past Medical History:  Diagnosis Date   Carpal tunnel syndrome    left    Past Surgical History:  Procedure Laterality Date   ANKLE SURGERY Right    CARPAL TUNNEL RELEASE Left 01/03/2020   Procedure: CARPAL TUNNEL RELEASE;  Surgeon: Betha Loa, MD;  Location: [facility];  Service: Orthopedics;  Laterality: Left;    Family History: Family History  Problem Relation Age of Onset   Atrial fibrillation Mother    Diabetes Mother    Heart failure Mother    Hyperlipidemia Mother    Hypertension Mother    Hypertension Father    Hypertension Sister    Diabetes Maternal Grandmother    Atrial fibrillation Maternal Grandmother    Hypertension Maternal Grandmother    Hyperlipidemia Maternal Grandmother    Heart failure Maternal Grandmother     Social History:   reports that he has never smoked. He has never used smokeless tobacco. He reports current alcohol use. He reports that he does not use drugs.  Medications: Medications Prior to Admission  Medication Sig Dispense Refill   Ascorbic Acid (VITAMIN C) 1000 MG tablet Take 500 mg by mouth daily.     b complex vitamins capsule Take 1 capsule by mouth daily.     cholecalciferol (VITAMIN D3) 25 MCG (1000 UNIT) tablet Take 1,000 Units by mouth daily.      No results found for this or any previous visit (from the past 48 hour(s)).  No results found.   A comprehensive review of systems was negative.  Blood pressure (!) 128/92, pulse 84, temperature 98.9 F (37.2 C), temperature source Oral, resp. rate 18, height 5\' 6"  (1.676 m), weight 83.1 kg, SpO2 97 %.  General appearance: alert, cooperative and appears stated age Head:  Normocephalic, without obvious abnormality, atraumatic Neck: supple, symmetrical, trachea midline Cardio: regular rate and rhythm Resp: clear to auscultation bilaterally Extremities: Intact sensation and capillary refill all digits.  +epl/fpl/io.  No wounds.  Pulses: 2+ and symmetric Skin: Skin color, texture, turgor normal. No rashes or lesions Neurologic: Grossly normal Incision/Wound: none  Assessment/Plan Right carpal tunnel syndrome.  Non operative and operative treatment options have been discussed with the patient and patient wishes to proceed with operative treatment. Risks, benefits, and alternatives of surgery have been discussed and the patient agrees with the plan of care.   03/06/2020, 12:07 PM

## 2020-03-06 NOTE — Discharge Instructions (Addendum)

## 2020-03-06 NOTE — Transfer of Care (Signed)
Immediate Anesthesia Transfer of Care Note  Patient: Timothy Pineda  Procedure(s) Performed: CARPAL TUNNEL RELEASE (Right Wrist)  Patient Location: PACU  Anesthesia Type:General  Level of Consciousness: sedated  Airway & Oxygen Therapy: Patient Spontanous Breathing and Patient connected to face mask oxygen  Post-op Assessment: Report given to RN and Post -op Vital signs reviewed and stable  Post vital signs: Reviewed and stable  Last Vitals:  Vitals Value Taken Time  BP 108/76 03/06/20 1355  Temp 36.4 C 03/06/20 1355  Pulse 83 03/06/20 1400  Resp 10 03/06/20 1400  SpO2 99 % 03/06/20 1400  Vitals shown include unvalidated device data.  Last Pain:  Vitals:   03/06/20 1355  TempSrc:   PainSc: Asleep         Complications: No complications documented.

## 2020-03-09 ENCOUNTER — Encounter (HOSPITAL_BASED_OUTPATIENT_CLINIC_OR_DEPARTMENT_OTHER): Payer: Self-pay | Admitting: Orthopedic Surgery

## 2020-06-25 DIAGNOSIS — M9903 Segmental and somatic dysfunction of lumbar region: Secondary | ICD-10-CM | POA: Diagnosis not present

## 2020-06-29 DIAGNOSIS — M65341 Trigger finger, right ring finger: Secondary | ICD-10-CM | POA: Diagnosis not present

## 2020-06-29 DIAGNOSIS — M65311 Trigger thumb, right thumb: Secondary | ICD-10-CM | POA: Diagnosis not present

## 2020-06-30 DIAGNOSIS — M9903 Segmental and somatic dysfunction of lumbar region: Secondary | ICD-10-CM | POA: Diagnosis not present

## 2020-07-08 DIAGNOSIS — M9903 Segmental and somatic dysfunction of lumbar region: Secondary | ICD-10-CM | POA: Diagnosis not present

## 2020-08-06 DIAGNOSIS — M9903 Segmental and somatic dysfunction of lumbar region: Secondary | ICD-10-CM | POA: Diagnosis not present

## 2020-08-11 DIAGNOSIS — M9903 Segmental and somatic dysfunction of lumbar region: Secondary | ICD-10-CM | POA: Diagnosis not present

## 2020-10-27 DIAGNOSIS — M9903 Segmental and somatic dysfunction of lumbar region: Secondary | ICD-10-CM | POA: Diagnosis not present

## 2020-11-03 DIAGNOSIS — M9903 Segmental and somatic dysfunction of lumbar region: Secondary | ICD-10-CM | POA: Diagnosis not present

## 2020-11-05 DIAGNOSIS — Z1159 Encounter for screening for other viral diseases: Secondary | ICD-10-CM | POA: Diagnosis not present

## 2020-11-05 DIAGNOSIS — R7301 Impaired fasting glucose: Secondary | ICD-10-CM | POA: Diagnosis not present

## 2020-11-05 DIAGNOSIS — R5382 Chronic fatigue, unspecified: Secondary | ICD-10-CM | POA: Diagnosis not present

## 2020-11-05 DIAGNOSIS — Z0000 Encounter for general adult medical examination without abnormal findings: Secondary | ICD-10-CM | POA: Diagnosis not present

## 2020-11-05 DIAGNOSIS — Z1321 Encounter for screening for nutritional disorder: Secondary | ICD-10-CM | POA: Diagnosis not present

## 2020-11-05 DIAGNOSIS — R030 Elevated blood-pressure reading, without diagnosis of hypertension: Secondary | ICD-10-CM | POA: Diagnosis not present

## 2020-11-05 DIAGNOSIS — Z13220 Encounter for screening for lipoid disorders: Secondary | ICD-10-CM | POA: Diagnosis not present

## 2020-11-05 DIAGNOSIS — Z8249 Family history of ischemic heart disease and other diseases of the circulatory system: Secondary | ICD-10-CM | POA: Diagnosis not present

## 2020-11-05 DIAGNOSIS — Z13228 Encounter for screening for other metabolic disorders: Secondary | ICD-10-CM | POA: Diagnosis not present

## 2020-11-05 DIAGNOSIS — Z79899 Other long term (current) drug therapy: Secondary | ICD-10-CM | POA: Diagnosis not present

## 2020-11-05 DIAGNOSIS — F5101 Primary insomnia: Secondary | ICD-10-CM | POA: Diagnosis not present

## 2020-11-10 DIAGNOSIS — M9903 Segmental and somatic dysfunction of lumbar region: Secondary | ICD-10-CM | POA: Diagnosis not present

## 2020-12-01 DIAGNOSIS — M9903 Segmental and somatic dysfunction of lumbar region: Secondary | ICD-10-CM | POA: Diagnosis not present

## 2020-12-09 DIAGNOSIS — M9903 Segmental and somatic dysfunction of lumbar region: Secondary | ICD-10-CM | POA: Diagnosis not present

## 2020-12-16 DIAGNOSIS — M9903 Segmental and somatic dysfunction of lumbar region: Secondary | ICD-10-CM | POA: Diagnosis not present

## 2021-03-24 DIAGNOSIS — M9903 Segmental and somatic dysfunction of lumbar region: Secondary | ICD-10-CM | POA: Diagnosis not present

## 2021-03-30 DIAGNOSIS — M9903 Segmental and somatic dysfunction of lumbar region: Secondary | ICD-10-CM | POA: Diagnosis not present

## 2021-04-13 DIAGNOSIS — M9903 Segmental and somatic dysfunction of lumbar region: Secondary | ICD-10-CM | POA: Diagnosis not present

## 2021-04-27 DIAGNOSIS — M9903 Segmental and somatic dysfunction of lumbar region: Secondary | ICD-10-CM | POA: Diagnosis not present

## 2021-05-03 DIAGNOSIS — M9903 Segmental and somatic dysfunction of lumbar region: Secondary | ICD-10-CM | POA: Diagnosis not present

## 2021-05-10 DIAGNOSIS — M9903 Segmental and somatic dysfunction of lumbar region: Secondary | ICD-10-CM | POA: Diagnosis not present

## 2021-05-17 DIAGNOSIS — M9903 Segmental and somatic dysfunction of lumbar region: Secondary | ICD-10-CM | POA: Diagnosis not present

## 2021-05-25 DIAGNOSIS — M9903 Segmental and somatic dysfunction of lumbar region: Secondary | ICD-10-CM | POA: Diagnosis not present

## 2021-06-01 DIAGNOSIS — M9903 Segmental and somatic dysfunction of lumbar region: Secondary | ICD-10-CM | POA: Diagnosis not present

## 2021-06-15 DIAGNOSIS — M9903 Segmental and somatic dysfunction of lumbar region: Secondary | ICD-10-CM | POA: Diagnosis not present

## 2021-06-29 DIAGNOSIS — M9903 Segmental and somatic dysfunction of lumbar region: Secondary | ICD-10-CM | POA: Diagnosis not present

## 2021-07-13 DIAGNOSIS — M9903 Segmental and somatic dysfunction of lumbar region: Secondary | ICD-10-CM | POA: Diagnosis not present

## 2021-07-27 DIAGNOSIS — M9903 Segmental and somatic dysfunction of lumbar region: Secondary | ICD-10-CM | POA: Diagnosis not present

## 2021-08-10 DIAGNOSIS — M9903 Segmental and somatic dysfunction of lumbar region: Secondary | ICD-10-CM | POA: Diagnosis not present

## 2021-08-24 DIAGNOSIS — M9903 Segmental and somatic dysfunction of lumbar region: Secondary | ICD-10-CM | POA: Diagnosis not present

## 2021-09-07 DIAGNOSIS — M9903 Segmental and somatic dysfunction of lumbar region: Secondary | ICD-10-CM | POA: Diagnosis not present

## 2021-09-21 DIAGNOSIS — M9903 Segmental and somatic dysfunction of lumbar region: Secondary | ICD-10-CM | POA: Diagnosis not present

## 2021-10-05 DIAGNOSIS — M9903 Segmental and somatic dysfunction of lumbar region: Secondary | ICD-10-CM | POA: Diagnosis not present

## 2021-11-16 DIAGNOSIS — M9903 Segmental and somatic dysfunction of lumbar region: Secondary | ICD-10-CM | POA: Diagnosis not present

## 2021-11-30 DIAGNOSIS — M9903 Segmental and somatic dysfunction of lumbar region: Secondary | ICD-10-CM | POA: Diagnosis not present

## 2021-12-14 DIAGNOSIS — M9903 Segmental and somatic dysfunction of lumbar region: Secondary | ICD-10-CM | POA: Diagnosis not present

## 2022-01-04 DIAGNOSIS — M9903 Segmental and somatic dysfunction of lumbar region: Secondary | ICD-10-CM | POA: Diagnosis not present

## 2022-01-25 DIAGNOSIS — M9903 Segmental and somatic dysfunction of lumbar region: Secondary | ICD-10-CM | POA: Diagnosis not present

## 2022-05-10 DIAGNOSIS — M9903 Segmental and somatic dysfunction of lumbar region: Secondary | ICD-10-CM | POA: Diagnosis not present

## 2022-06-07 DIAGNOSIS — M9903 Segmental and somatic dysfunction of lumbar region: Secondary | ICD-10-CM | POA: Diagnosis not present

## 2022-06-28 DIAGNOSIS — M9903 Segmental and somatic dysfunction of lumbar region: Secondary | ICD-10-CM | POA: Diagnosis not present

## 2024-07-09 ENCOUNTER — Other Ambulatory Visit (HOSPITAL_COMMUNITY): Payer: Self-pay

## 2024-07-09 DIAGNOSIS — R0609 Other forms of dyspnea: Secondary | ICD-10-CM

## 2024-07-10 ENCOUNTER — Ambulatory Visit (HOSPITAL_COMMUNITY)
Admission: RE | Admit: 2024-07-10 | Discharge: 2024-07-10 | Disposition: A | Discharge status: Home / Self Care | Source: Ambulatory Visit

## 2024-07-10 DIAGNOSIS — I517 Cardiomegaly: Secondary | ICD-10-CM | POA: Insufficient documentation

## 2024-07-10 DIAGNOSIS — R0600 Dyspnea, unspecified: Secondary | ICD-10-CM | POA: Diagnosis present

## 2024-07-10 DIAGNOSIS — R0609 Other forms of dyspnea: Secondary | ICD-10-CM | POA: Diagnosis not present

## 2024-07-10 LAB — ECHOCARDIOGRAM COMPLETE
AR max vel: 2.55 cm2
AV Area VTI: 2.38 cm2
AV Area mean vel: 2.45 cm2
AV Mean grad: 2 mmHg
AV Peak grad: 4.4 mmHg
Ao pk vel: 1.05 m/s
Area-P 1/2: 3.53 cm2
Calc EF: 60.3 %
S' Lateral: 3.1 cm
Single Plane A2C EF: 58.7 %
Single Plane A4C EF: 60.1 %
